# Patient Record
Sex: Female | Born: 1995 | Race: Black or African American | Hispanic: No | Marital: Single | Smoking: Current every day smoker
Health system: Southern US, Community
[De-identification: ages and names within clinical notes are randomized; demographics above are authoritative.]

## PROBLEM LIST (undated history)

## (undated) ENCOUNTER — Inpatient Hospital Stay: Payer: Self-pay

## (undated) DIAGNOSIS — Z789 Other specified health status: Secondary | ICD-10-CM

## (undated) DIAGNOSIS — D649 Anemia, unspecified: Secondary | ICD-10-CM

## (undated) HISTORY — PX: DENTAL SURGERY: SHX609

## (undated) HISTORY — DX: Anemia, unspecified: D64.9

## (undated) SURGERY — Surgical Case
Anesthesia: Spinal

---
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
## 2015-05-31 ENCOUNTER — Encounter: Payer: Self-pay | Admitting: Emergency Medicine

## 2015-05-31 ENCOUNTER — Emergency Department
Admission: EM | Admit: 2015-05-31 | Discharge: 2015-05-31 | Disposition: A | Discharge status: Home / Self Care | Payer: Medicaid Other | Attending: Emergency Medicine | Admitting: Emergency Medicine

## 2015-05-31 DIAGNOSIS — Z3A08 8 weeks gestation of pregnancy: Secondary | ICD-10-CM | POA: Diagnosis not present

## 2015-05-31 DIAGNOSIS — Z79899 Other long term (current) drug therapy: Secondary | ICD-10-CM | POA: Insufficient documentation

## 2015-05-31 DIAGNOSIS — O210 Mild hyperemesis gravidarum: Secondary | ICD-10-CM | POA: Insufficient documentation

## 2015-05-31 DIAGNOSIS — Z87891 Personal history of nicotine dependence: Secondary | ICD-10-CM | POA: Insufficient documentation

## 2015-05-31 DIAGNOSIS — O219 Vomiting of pregnancy, unspecified: Secondary | ICD-10-CM

## 2015-05-31 DIAGNOSIS — Z880 Allergy status to penicillin: Secondary | ICD-10-CM | POA: Diagnosis not present

## 2015-05-31 LAB — URINALYSIS COMPLETE WITH MICROSCOPIC (ARMC ONLY)
BACTERIA UA: NONE SEEN
Bilirubin Urine: NEGATIVE
GLUCOSE, UA: 50 mg/dL — AB
HGB URINE DIPSTICK: NEGATIVE
LEUKOCYTES UA: NEGATIVE
NITRITE: NEGATIVE
Protein, ur: 100 mg/dL — AB
SPECIFIC GRAVITY, URINE: 1.024 (ref 1.005–1.030)
pH: 6 (ref 5.0–8.0)

## 2015-05-31 LAB — CBC
HEMATOCRIT: 39.5 % (ref 35.0–47.0)
Hemoglobin: 12.8 g/dL (ref 12.0–16.0)
MCH: 26.8 pg (ref 26.0–34.0)
MCHC: 32.5 g/dL (ref 32.0–36.0)
MCV: 82.2 fL (ref 80.0–100.0)
Platelets: 325 10*3/uL (ref 150–440)
RBC: 4.8 MIL/uL (ref 3.80–5.20)
RDW: 12.8 % (ref 11.5–14.5)
WBC: 12 10*3/uL — AB (ref 3.6–11.0)

## 2015-05-31 LAB — COMPREHENSIVE METABOLIC PANEL
ALT: 12 U/L — ABNORMAL LOW (ref 14–54)
AST: 19 U/L (ref 15–41)
Albumin: 5.3 g/dL — ABNORMAL HIGH (ref 3.5–5.0)
Alkaline Phosphatase: 54 U/L (ref 38–126)
Anion gap: 11 (ref 5–15)
BUN: 11 mg/dL (ref 6–20)
CHLORIDE: 102 mmol/L (ref 101–111)
CO2: 25 mmol/L (ref 22–32)
Calcium: 11.2 mg/dL — ABNORMAL HIGH (ref 8.9–10.3)
Creatinine, Ser: 0.57 mg/dL (ref 0.44–1.00)
Glucose, Bld: 102 mg/dL — ABNORMAL HIGH (ref 65–99)
POTASSIUM: 3.5 mmol/L (ref 3.5–5.1)
Sodium: 138 mmol/L (ref 135–145)
Total Bilirubin: 0.9 mg/dL (ref 0.3–1.2)
Total Protein: 9.3 g/dL — ABNORMAL HIGH (ref 6.5–8.1)

## 2015-05-31 LAB — POCT PREGNANCY, URINE: Preg Test, Ur: POSITIVE — AB

## 2015-05-31 MED ORDER — SODIUM CHLORIDE 0.9 % IV BOLUS (SEPSIS)
1000.0000 mL | Freq: Once | INTRAVENOUS | Status: AC
  Administered 2015-05-31: 1000 mL via INTRAVENOUS

## 2015-05-31 MED ORDER — ONDANSETRON 4 MG PO TBDP: 4.0000 mg | ORAL_TABLET | Freq: Three times a day (TID) | ORAL | Status: DC | PRN

## 2015-05-31 MED ORDER — ONDANSETRON HCL 4 MG/2ML IJ SOLN
4.0000 mg | Freq: Once | INTRAMUSCULAR | Status: AC
  Administered 2015-05-31: 4 mg via INTRAVENOUS
  Filled 2015-05-31: qty 2

## 2015-05-31 NOTE — ED Provider Notes (Signed)
[facility] Emergency Department Provider Note  Time seen: 5:12 PM  I have reviewed the triage vital signs and the nursing notes.   HISTORY  Chief Complaint Morning Sickness    HPI Shelby Cooper is a 19 y.o. female who is approximately 7-[redacted] weeks pregnant presents the department for nausea and vomiting. According to the patient for the past several weeks she has felt extremely nauseated with nearly daily vomiting. For the past few days she has been unable to keep down any fluids. Patient went to a doctor yesterday who diagnosed her with pregnancy, but did not prescribe her a nausea medication. Continues to be nauseated today so she came to the emergency department. Patient denies abdominal pain, vaginal bleeding. Denies diarrhea. Describes her nausea severe. Unable to swallow her saliva as it'll make her vomit per patient.     History reviewed. No pertinent past medical history.  There are no active problems to display for this patient.   History reviewed. No pertinent past surgical history.  Current Outpatient Rx  Name  Route  Sig  Dispense  Refill  . Prenatal Vit-Fe Fumarate-FA (PRENATAL MULTIVITAMIN) TABS tablet   Oral   Take 1 tablet by mouth daily at 12 noon.           Allergies Penicillins  History reviewed. No pertinent family history.  Social History Social History  Substance Use Topics  . Smoking status: Former Games developermoker  . Smokeless tobacco: None  . Alcohol Use: No    Review of Systems Constitutional: Negative for fever. Cardiovascular: Negative for chest pain. Respiratory: Negative for shortness of breath. Gastrointestinal: Negative for abdominal pain. Positive nausea/vomiting Genitourinary: Negative for dysuria. Neurological: Negative for headache 10-point ROS otherwise negative.  ____________________________________________   PHYSICAL EXAM:  VITAL SIGNS: ED Triage Vitals  Enc Vitals Group     BP 05/31/15 1626 124/78  mmHg     Pulse Rate 05/31/15 1626 100     Resp --      Temp 05/31/15 1626 98.6 F (37 C)     Temp Source 05/31/15 1626 Oral     SpO2 05/31/15 1626 100 %     Weight 05/31/15 1626 102 lb (46.267 kg)     Height 05/31/15 1626 5\' 1"  (1.549 m)     Head Cir --      Peak Flow --      Pain Score --      Pain Loc --      Pain Edu? --      Excl. in GC? --     Constitutional: Alert and oriented. Well appearing and in no distress. Eyes: Normal exam ENT   Head: Normocephalic and atraumatic   Mouth/Throat: Mucous membranes are moist. Cardiovascular: Normal rate, regular rhythm. No murmur. Rate around 100 bpm Respiratory: Normal respiratory effort without tachypnea nor retractions. Breath sounds are clear Gastrointestinal: Soft and nontender. No distention.  Musculoskeletal: Nontender with normal range of motion in all extremities. Neurologic:  Normal speech and language. No gross focal neurologic deficits  Skin:  Skin is warm, dry and intact.  Psychiatric: Mood and affect are normal. Speech and behavior are normal.   ____________________________________________     INITIAL IMPRESSION / ASSESSMENT AND PLAN / ED COURSE  Pertinent labs & imaging results that were available during my care of the patient were reviewed by me and considered in my medical decision making (see chart for details).  Patient presents with nausea/vomiting, unable to eat or keep down fluids for the  past several days. Patient is approximate 7-[redacted] weeks pregnant by LMP. Denies abdominal pain. We will check labs, IV hydrate. I discussed with the patient the risks and benefits of using Zofran, the patient is aware of the wrist after our conversation, but wishes to proceed with the use of the medication.  Patient states she is feeling much better at this time. She wishes to go home. We'll discharge the patient home with Zofran ODT and she is to follow-up with her OB/GYN per patient  agreeable.  ____________________________________________   FINAL CLINICAL IMPRESSION(S) / ED DIAGNOSES  Nausea and vomiting of pregnancy   Minna Antis, MD 05/31/15 1850

## 2015-05-31 NOTE — ED Notes (Signed)
Pt from home for N/V unable to eat. Pt told at health dept she was [redacted] weeks pregnant

## 2015-05-31 NOTE — Discharge Instructions (Signed)

## 2015-06-08 ENCOUNTER — Other Ambulatory Visit: Payer: Self-pay | Admitting: Advanced Practice Midwife

## 2015-06-08 DIAGNOSIS — Z369 Encounter for antenatal screening, unspecified: Secondary | ICD-10-CM

## 2015-06-20 LAB — OB RESULTS CONSOLE HIV ANTIBODY (ROUTINE TESTING): HIV: NONREACTIVE

## 2015-07-03 ENCOUNTER — Ambulatory Visit: Payer: Self-pay

## 2015-07-03 ENCOUNTER — Ambulatory Visit: Payer: Self-pay | Attending: Advanced Practice Midwife

## 2015-07-06 ENCOUNTER — Ambulatory Visit (HOSPITAL_BASED_OUTPATIENT_CLINIC_OR_DEPARTMENT_OTHER)
Admission: RE | Admit: 2015-07-06 | Discharge: 2015-07-06 | Disposition: A | Discharge status: Home / Self Care | Payer: Medicaid Other | Source: Ambulatory Visit

## 2015-07-06 ENCOUNTER — Ambulatory Visit
Admission: RE | Admit: 2015-07-06 | Discharge: 2015-07-06 | Disposition: A | Discharge status: Home / Self Care | Payer: Medicaid Other | Source: Ambulatory Visit | Attending: Advanced Practice Midwife | Admitting: Advanced Practice Midwife

## 2015-07-06 VITALS — BP 109/69 | HR 100 | Temp 98.8°F | Resp 18 | Ht 61.2 in | Wt 112.8 lb

## 2015-07-06 DIAGNOSIS — Z0373 Encounter for suspected fetal anomaly ruled out: Secondary | ICD-10-CM

## 2015-07-06 DIAGNOSIS — Z369 Encounter for antenatal screening, unspecified: Secondary | ICD-10-CM

## 2015-07-06 DIAGNOSIS — Z36 Encounter for antenatal screening of mother: Secondary | ICD-10-CM | POA: Insufficient documentation

## 2015-07-06 DIAGNOSIS — Z3A13 13 weeks gestation of pregnancy: Secondary | ICD-10-CM | POA: Diagnosis not present

## 2015-07-06 NOTE — Progress Notes (Addendum)
Shelby Cooper, Shelby Cooper Length of Consultation: 30 minutes   [name]  was referred to [facility] for genetic counseling to review prenatal screening and testing options.  This note summarizes the information we discussed.    We offered the following routine screening tests for this pregnancy:  First trimester screening, which includes nuchal translucency ultrasound screen and first trimester maternal serum marker screening.  The nuchal translucency has approximately an 80% detection rate for Down syndrome and can be positive for other chromosome abnormalities as well as congenital heart defects.  When combined with Cooper maternal serum marker screening, the detection rate is up to 90% for Down syndrome and up to 97% for trisomy 18.     Maternal serum marker screening, Cooper blood test that measures pregnancy proteins, can provide risk assessments for Down syndrome, trisomy 18, and open neural tube defects (spina bifida, anencephaly). Because it does not directly examine the fetus, it cannot positively diagnose or rule out these problems.  Targeted ultrasound uses high frequency sound waves to create an image of the developing fetus.  An ultrasound is often recommended as Cooper routine means of evaluating the pregnancy.  It is also used to screen for fetal anatomy problems (for example, Cooper heart defect) that might be suggestive of Cooper chromosomal or other abnormality.   Should these screening tests indicate an increased concern, then the following additional testing options would be offered:  The chorionic villus sampling procedure is available for first trimester chromosome analysis.  This involves the withdrawal of Cooper small amount of chorionic villi (tissue from the developing placenta).  Risk of pregnancy loss is estimated to be approximately 1 in 200 to 1 in 100 (0.5 to 1%).  There is approximately Cooper 1% (1 in 100) chance that the CVS chromosome results will be unclear.  Chorionic villi cannot  be tested for neural tube defects.     Amniocentesis involves the removal of Cooper small amount of amniotic fluid from the sac surrounding the fetus with the use of Cooper thin needle inserted through the maternal abdomen and uterus.  Ultrasound guidance is used throughout the procedure.  Fetal cells from amniotic fluid are directly evaluated and > 99.5% of chromosome problems and > 98% of open neural tube defects can be detected. This procedure is generally performed after the 15th week of pregnancy.  The main risks to this procedure include complications leading to miscarriage in less than 1 in 200 cases (0.5%).  As another option for information if the pregnancy is suspected to be an an increased chance for certain chromosome conditions, we also reviewed the availability of cell free fetal DNA testing from maternal blood to determine whether or not the baby may have either Down syndrome, trisomy 1113, or trisomy 7118.  This test utilizes Cooper maternal blood sample and DNA sequencing technology to isolate circulating cell free fetal DNA from maternal plasma.  The fetal DNA can then be analyzed for DNA sequences that are derived from the three most common chromosomes involved in aneuploidy, chromosomes 13, 18, and 21.  If the overall amount of DNA is greater than the expected level for any of these chromosomes, aneuploidy is suspected.  While we do not consider it Cooper replacement for invasive testing and karyotype analysis, Cooper negative result from this testing would be reassuring, though not Cooper guarantee of Cooper normal chromosome complement for the baby.  An abnormal result is certainly suggestive of an abnormal chromosome complement, though we would still recommend  CVS or amniocentesis to confirm any findings from this testing.   Cystic Fibrosis screening was also discussed with the patient. Cystic fibrosis (CF) is one of the most common genetic conditions in persons of Caucasian ancestry.  This condition occurs in approximately  1 in 2,500 Caucasian persons and results in thickened secretions in the lungs, digestive, and reproductive systems.  For Cooper baby to be at risk for having CF, both of the parents must be carriers for this condition.  Approximately 1 in 6 Caucasian persons is Cooper carrier for CF.  Current carrier testing looks for the most common mutations in the gene for CF and can detect approximately 90% of carriers in the Caucasian population.  This means that the carrier screening can greatly reduce, but cannot eliminate, the chance for an individual to have Cooper child with CF.  If an individual is found to be Cooper carrier for CF, then carrier testing would be available for the partner. As part of Shelby Cooper's newborn screening profile, all babies born in the state of [place] will have Cooper two-tier screening process.  Specimens are first tested to determine the concentration of immunoreactive trypsinogen (IRT).  The top 5% of specimens with the highest IRT values then undergo DNA testing using Cooper panel of over 40 common CF mutations.   We obtained Cooper detailed family history and pregnancy history.  The family history was reported to be unremarkable for birth defects, mental retardation, recurrent pregnancy loss or known chromosome abnormalities.  Both Shelby Cooper and her partner are of African American ancestry.  No results of sickle cell testing were in the records sent from [facility].  If this testing has not been performed as part of her new OB labs, we would recommend this testing.  Her MCV is normal.  Shelby Cooper stated that this is the first pregnancy for her. The father of the baby, Shelby Cooper, has Cooper healthy 62 year old daughter.  She reported no complications or exposures in this pregnancy that would be expected to increase the risk for birth defects.  After consideration of the options, Shelby Cooper elected to proceed with first trimester screening.  An ultrasound was performed at the time of the visit.  The gestational  age was consistent with 13 weeks.  Fetal anatomy could not be assessed due to early gestational age.  Please refer to the ultrasound report for details of that study.  Ms. Burger was encouraged to call with questions or concerns.  We can be contacted at [phone].  Cherly Anderson, MS, CGC    I reviewed the ultrasound with the patient and her partner  I agree with the counselors note  Jimmey Ralph, MD

## 2015-07-13 ENCOUNTER — Telehealth: Payer: Self-pay | Admitting: Obstetrics and Gynecology

## 2015-07-13 NOTE — Telephone Encounter (Signed)
  Ms. Haan elected to undergo First Trimester screening on 07/06/2015.  To review, first trimester screening, includes nuchal translucency ultrasound screen and/or first trimester maternal serum marker screening.  The nuchal translucency has approximately an 80% detection rate for Down syndrome and can be positive for other chromosome abnormalities as well as heart defects.  When combined with a maternal serum marker screening, the detection rate is up to 90% for Down syndrome and up to 97% for trisomy 13 and 18.     The results of the First Trimester Nuchal Translucency and Biochemical Screening were within normal range.  The risk for Down syndrome is now estimated to be less than 1 in 10,000.  The risk for Trisomy 13/18 is also estimated to be less than 1 in 10,000.  Should more definitive information be desired, we would offer amniocentesis.  Because we do not yet know the effectiveness of combined first and second trimester screening, we do not recommend a maternal serum screen to assess the chance for chromosome conditions.  However, if screening for neural tube defects is desired, maternal serum screening for AFP only can be performed between 15 and [redacted] weeks gestation.     Cherly Anderson, MS, CGC

## 2015-08-10 ENCOUNTER — Ambulatory Visit
Admission: RE | Admit: 2015-08-10 | Discharge: 2015-08-10 | Disposition: A | Discharge status: Home / Self Care | Payer: Medicaid Other | Source: Ambulatory Visit | Attending: Maternal & Fetal Medicine | Admitting: Maternal & Fetal Medicine

## 2015-08-10 ENCOUNTER — Other Ambulatory Visit: Payer: Self-pay | Admitting: Obstetrics and Gynecology

## 2015-08-10 DIAGNOSIS — Z3A18 18 weeks gestation of pregnancy: Secondary | ICD-10-CM | POA: Insufficient documentation

## 2015-08-10 DIAGNOSIS — Z0373 Encounter for suspected fetal anomaly ruled out: Secondary | ICD-10-CM | POA: Diagnosis present

## 2015-08-10 DIAGNOSIS — Z36 Encounter for antenatal screening of mother: Secondary | ICD-10-CM | POA: Diagnosis not present

## 2015-10-13 LAB — OB RESULTS CONSOLE HIV ANTIBODY (ROUTINE TESTING)
HIV: NONREACTIVE
HIV: NONREACTIVE

## 2015-10-13 LAB — OB RESULTS CONSOLE ABO/RH
RH TYPE: POSITIVE
RH Type: POSITIVE

## 2015-10-13 LAB — OB RESULTS CONSOLE RUBELLA ANTIBODY, IGM
RUBELLA: IMMUNE
Rubella: IMMUNE

## 2015-10-13 LAB — OB RESULTS CONSOLE VARICELLA ZOSTER ANTIBODY, IGG
Varicella: IMMUNE
Varicella: IMMUNE

## 2015-10-13 LAB — OB RESULTS CONSOLE RPR
RPR: NONREACTIVE
RPR: NONREACTIVE

## 2015-10-13 LAB — OB RESULTS CONSOLE HEPATITIS B SURFACE ANTIGEN
HEP B S AG: NEGATIVE
Hepatitis B Surface Ag: NEGATIVE

## 2015-10-29 LAB — OB RESULTS CONSOLE VARICELLA ZOSTER ANTIBODY, IGG: Varicella: IMMUNE

## 2015-10-29 LAB — OB RESULTS CONSOLE ABO/RH: RH Type: POSITIVE

## 2015-10-29 LAB — OB RESULTS CONSOLE RUBELLA ANTIBODY, IGM: Rubella: IMMUNE

## 2015-10-29 LAB — OB RESULTS CONSOLE GC/CHLAMYDIA
CHLAMYDIA, DNA PROBE: NEGATIVE
GC PROBE AMP, GENITAL: NEGATIVE

## 2015-10-29 LAB — OB RESULTS CONSOLE RPR: RPR: NONREACTIVE

## 2015-12-21 ENCOUNTER — Observation Stay
Admission: EM | Admit: 2015-12-21 | Discharge: 2015-12-21 | Disposition: A | Discharge status: Home / Self Care | Payer: Medicaid Other | Attending: Obstetrics & Gynecology | Admitting: Obstetrics & Gynecology

## 2015-12-21 DIAGNOSIS — O9989 Other specified diseases and conditions complicating pregnancy, childbirth and the puerperium: Principal | ICD-10-CM | POA: Insufficient documentation

## 2015-12-21 DIAGNOSIS — Z3A31 31 weeks gestation of pregnancy: Secondary | ICD-10-CM | POA: Diagnosis not present

## 2015-12-21 NOTE — OB Triage Note (Signed)
Pt presents to l/d with c/o thinking her water broke. No big gush of fluid. States she was on the way to grocery store and "got choked". Had a small leakage of fluid after that. nitrazine negative. Cervix closed.

## 2015-12-21 NOTE — Discharge Summary (Addendum)
Patient presented for evaluation of rupture of membranes.  Patient had cervical exam, and nitrizine test by RN and this was reported to me.  Nitrazine was negative.  Baby was ballottable on exam.  She ambulated for 30mins with a pad and no fluid was collected.  She was again nitrazined and was negative.  I reviewed her vital signs and fetal tracing, both of which were reassuring.  Patient was discharged as she was not laboring and not ruptured.  NST interpretation: Reactive.  Ranae Plumberhelsea Bern Fare, MD Attending Obstetrician and Gynecologist [facility]

## 2015-12-29 LAB — OB RESULTS CONSOLE GBS: STREP GROUP B AG: POSITIVE

## 2016-01-04 ENCOUNTER — Observation Stay
Admission: EM | Admit: 2016-01-04 | Discharge: 2016-01-04 | Disposition: A | Discharge status: Home / Self Care | Payer: Medicaid Other | Attending: Obstetrics and Gynecology | Admitting: Obstetrics and Gynecology

## 2016-01-04 DIAGNOSIS — Z3A39 39 weeks gestation of pregnancy: Secondary | ICD-10-CM | POA: Diagnosis not present

## 2016-01-04 DIAGNOSIS — O9989 Other specified diseases and conditions complicating pregnancy, childbirth and the puerperium: Secondary | ICD-10-CM | POA: Diagnosis present

## 2016-01-04 DIAGNOSIS — O4292 Full-term premature rupture of membranes, unspecified as to length of time between rupture and onset of labor: Principal | ICD-10-CM | POA: Insufficient documentation

## 2016-01-04 DIAGNOSIS — O4290 Premature rupture of membranes, unspecified as to length of time between rupture and onset of labor, unspecified weeks of gestation: Secondary | ICD-10-CM | POA: Diagnosis present

## 2016-01-04 NOTE — Final Progress Note (Addendum)
Physician Final Progress Note  Patient ID: Delonda Coley MRN: [id number] DOB/AGE: [birth year] 20 y.o.  Admit date: 01/04/2016 Admitting provider: Vena Austria, MD Discharge date: 01/04/2016   Admission Diagnoses: Leaking fluid  Discharge Diagnoses:  Active Problems:   Amniotic fluid leaking  Patient is a 20 year old G1P0 [redacted]w[redacted]d followed at [facility] presenting with concerns for leaking fluid.  On presentation cervix closed, no evidence of SROM on exam, reactive NST, and stable vitals.  Routine labor precautions, discharge home.  This patient was triaged remotely.  Consults: None  Significant Findings/ Diagnostic Studies: Filed Vitals:   01/04/16 1244 01/04/16 1252  Temp: 98.6 F (37 C)   TempSrc: Oral   Resp: 18   Height:   (1.549 m)  Weight:  73.936 kg (163 lb)   Procedures: NST - fetal heart tone 150, moderate variability, + 15 x 15 accels, no decelerations.  Toco irregular.    Discharge Condition: good  Disposition: 01-Home or Self Care  Diet: Regular diet  Discharge Activity: Activity as tolerated  Discharge Instructions    Discharge activity:  No Restrictions    Complete by:  As directed      Fetal Kick Count:  Lie on our left side for one hour after a meal, and count the number of times your baby kicks.  If it is less than 5 times, get up, move around and drink some juice.  Repeat the test 30 minutes later.  If it is still less than 5 kicks in an hour, notify your doctor.    Complete by:  As directed      LABOR:  When conractions begin, you should start to time them from the beginning of one contraction to the beginning  of the next.  When contractions are 5 - 10 minutes apart or less and have been regular for at least an hour, you should call your health care provider.    Complete by:  As directed      No sexual activity restrictions    Complete by:  As directed      Notify physician for bleeding from the vagina    Complete by:  As directed       Notify physician for blurring of vision or spots before the eyes    Complete by:  As directed      Notify physician for chills or fever    Complete by:  As directed      Notify physician for fainting spells, "black outs" or loss of consciousness    Complete by:  As directed      Notify physician for increase in vaginal discharge    Complete by:  As directed      Notify physician for leaking of fluid    Complete by:  As directed      Notify physician for pain or burning when urinating    Complete by:  As directed      Notify physician for pelvic pressure (sudden increase)    Complete by:  As directed      Notify physician for severe or continued nausea or vomiting    Complete by:  As directed      Notify physician for sudden gushing of fluid from the vagina (with or without continued leaking)    Complete by:  As directed      Notify physician for sudden, constant, or occasional abdominal pain    Complete by:  As directed  Notify physician if baby moving less than usual    Complete by:  As directed             Medication List    TAKE these medications        ondansetron 4 MG disintegrating tablet  Commonly known as:  ZOFRAN ODT  Take 1 tablet (4 mg total) by mouth every 8 (eight) hours as needed for nausea or vomiting.     prenatal multivitamin Tabs tablet  Take 1 tablet by mouth daily at 12 noon.         Total time spent taking care of this patient: 10 minutes.  Triaged remotely exam by nursing staff, interpretation of NST by myself.  SignedLorrene Reid: Ivon Oelkers M 01/04/2016, 2:02 PM

## 2016-01-04 NOTE — OB Triage Note (Signed)
G1P0 Patient complains of leaking fluid starting around 0800 this morning and has noticed leaking on and off throughout the rest of the morning.  Notes clear fluid.  No contractions.

## 2016-01-07 ENCOUNTER — Inpatient Hospital Stay
Admission: EM | Admit: 2016-01-07 | Discharge: 2016-01-11 | DRG: 765 | Disposition: A | Discharge status: Home / Self Care | Payer: Medicaid Other | Attending: Obstetrics and Gynecology | Admitting: Obstetrics and Gynecology

## 2016-01-07 ENCOUNTER — Encounter: Payer: Self-pay | Admitting: *Deleted

## 2016-01-07 DIAGNOSIS — D62 Acute posthemorrhagic anemia: Secondary | ICD-10-CM | POA: Diagnosis present

## 2016-01-07 DIAGNOSIS — O9081 Anemia of the puerperium: Secondary | ICD-10-CM | POA: Diagnosis not present

## 2016-01-07 DIAGNOSIS — O864 Pyrexia of unknown origin following delivery: Secondary | ICD-10-CM | POA: Diagnosis not present

## 2016-01-07 DIAGNOSIS — Z87891 Personal history of nicotine dependence: Secondary | ICD-10-CM

## 2016-01-07 DIAGNOSIS — O99824 Streptococcus B carrier state complicating childbirth: Secondary | ICD-10-CM | POA: Diagnosis present

## 2016-01-07 DIAGNOSIS — R000 Tachycardia, unspecified: Secondary | ICD-10-CM | POA: Diagnosis not present

## 2016-01-07 DIAGNOSIS — O411230 Chorioamnionitis, third trimester, not applicable or unspecified: Secondary | ICD-10-CM | POA: Diagnosis not present

## 2016-01-07 DIAGNOSIS — Z3A39 39 weeks gestation of pregnancy: Secondary | ICD-10-CM | POA: Diagnosis not present

## 2016-01-07 HISTORY — DX: Other specified health status: Z78.9

## 2016-01-07 LAB — TYPE AND SCREEN
ABO/RH(D): AB POS
Antibody Screen: NEGATIVE

## 2016-01-07 LAB — CBC
HCT: 34.6 % — ABNORMAL LOW (ref 35.0–47.0)
Hemoglobin: 11 g/dL — ABNORMAL LOW (ref 12.0–16.0)
MCH: 24.1 pg — ABNORMAL LOW (ref 26.0–34.0)
MCHC: 31.9 g/dL — ABNORMAL LOW (ref 32.0–36.0)
MCV: 75.5 fL — ABNORMAL LOW (ref 80.0–100.0)
PLATELETS: 389 10*3/uL (ref 150–440)
RBC: 4.58 MIL/uL (ref 3.80–5.20)
RDW: 17.4 % — ABNORMAL HIGH (ref 11.5–14.5)
WBC: 14.8 10*3/uL — AB (ref 3.6–11.0)

## 2016-01-07 MED ORDER — LACTATED RINGERS IV SOLN: 500.0000 mL | INTRAVENOUS | Status: DC | PRN

## 2016-01-07 MED ORDER — LIDOCAINE HCL (PF) 1 % IJ SOLN
INTRAMUSCULAR | Status: AC
  Administered 2016-01-08: 1 mL via INTRADERMAL
  Filled 2016-01-07: qty 30

## 2016-01-07 MED ORDER — ONDANSETRON HCL 4 MG/2ML IJ SOLN
4.0000 mg | Freq: Four times a day (QID) | INTRAMUSCULAR | Status: DC | PRN
  Administered 2016-01-08: 8 mg via INTRAVENOUS

## 2016-01-07 MED ORDER — VANCOMYCIN HCL IN DEXTROSE 1-5 GM/200ML-% IV SOLN
1000.0000 mg | Freq: Two times a day (BID) | INTRAVENOUS | Status: DC
  Administered 2016-01-07: 1000 mg via INTRAVENOUS
  Filled 2016-01-07 (×2): qty 200

## 2016-01-07 MED ORDER — OXYTOCIN 40 UNITS IN LACTATED RINGERS INFUSION - SIMPLE MED
INTRAVENOUS | Status: AC
  Administered 2016-01-08: 1 m[IU]/min via INTRAVENOUS
  Filled 2016-01-07: qty 1000

## 2016-01-07 MED ORDER — ACETAMINOPHEN 325 MG PO TABS: 650.0000 mg | ORAL_TABLET | ORAL | Status: DC | PRN

## 2016-01-07 MED ORDER — LACTATED RINGERS IV SOLN
INTRAVENOUS | Status: DC
  Administered 2016-01-07 – 2016-01-08 (×2): via INTRAVENOUS

## 2016-01-07 MED ORDER — BUTORPHANOL TARTRATE 1 MG/ML IJ SOLN
2.0000 mg | INTRAMUSCULAR | Status: DC | PRN
  Administered 2016-01-07: 2 mg via INTRAVENOUS
  Filled 2016-01-07: qty 2

## 2016-01-07 MED ORDER — OXYTOCIN BOLUS FROM INFUSION: 500.0000 mL | INTRAVENOUS | Status: DC

## 2016-01-07 MED ORDER — OXYTOCIN 40 UNITS IN LACTATED RINGERS INFUSION - SIMPLE MED
2.5000 [IU]/h | INTRAVENOUS | Status: DC
  Administered 2016-01-08: 500 mL via INTRAVENOUS
  Filled 2016-01-07: qty 1000

## 2016-01-07 MED ORDER — MISOPROSTOL 200 MCG PO TABS
ORAL_TABLET | ORAL | Status: AC
  Filled 2016-01-07: qty 4

## 2016-01-07 NOTE — OB Triage Note (Signed)
Recvd to OBS1 per wheelchair from ED.  C/O contractions that started at 01/05/2016 2200.  Oriented to room and plan of care explained.  Verbalized understanding and agrees with plan.  .Marland Kitchen

## 2016-01-07 NOTE — Progress Notes (Signed)
Called by nursing staff to assist with EKG interpretation for patient in labor with tachycardia.  EKG shows sinus tachycardia without ischemic changes.  Machine reads possible age indeterminate anterior infarct, patient has very small Q waves (less than 1 mm depth) in leads I and aVL.  This seems to be an over-read by the machine.  No ST changes, and patient is asymptomatic as far as anginal symtpoms go.  Kristeen MissWILLIS, Vy Badley FIELDING [facility] 01/08/2016, 12:02 AM

## 2016-01-07 NOTE — H&P (Signed)
Obstetric History and Physical  Eunice BlaseKatana Freilich is a 20 y.o. G1P0 with Estimated Date of Delivery: 01/11/16 per LMP and 18 wk US who presents at 2624w3d  presenting for contractions. Patient states she has been having regular contractions, bloody show after cervical exam, intact membranes, with active fetal movement.    Prenatal Course Source of Care: [facility], tx from [facility]   Pregnancy complications or risks: Anemia - pt stopped Fe Macrosomia per 39 week US H/o Chlamydia with negative TOC H/o THC use with neg repeat testing  Patient Active Problem List   Diagnosis Date Noted  . Labor and delivery, indication for care 12/21/2015   She plans to bottle feed She desires Depo-Provera for postpartum contraception.   Prenatal labs and studies: ABO, Rh: AB+  Antibody: neg Rubella: Immune Varicella: Immune RPR:  NR HBsAg:  Neg HIV: Neg GC/CT: Neg/Neg GBS: positive 1 hr Glucola: 115   Genetic screening: 1st trimester and MSAFP negative  TDAP: UTD   Prenatal Transfer Tool   Past Medical History  Diagnosis Date  . Medical history non-contributory     Past Surgical History  Procedure Laterality Date  . Dental surgery      OB History  Gravida Para Term Preterm AB SAB TAB Ectopic Multiple Living  1             # Outcome Date GA Lbr Len/2nd Weight Sex Delivery Anes PTL Lv  1 Current               Social History   Social History  . Marital Status: Single    Spouse Name: N/A  . Number of Children: N/A  . Years of Education: N/A   Social History Main Topics  . Smoking status: Former Games developermoker  . Smokeless tobacco: None  . Alcohol Use: No  . Drug Use: No  . Sexual Activity: Yes   Other Topics Concern  . None   Social History Narrative    History reviewed. No pertinent family history.  Prescriptions prior to admission  Medication Sig Dispense Refill Last Dose  . ondansetron (ZOFRAN ODT) 4 MG disintegrating tablet Take 1 tablet (4 mg total) by mouth every 8 (eight)  hours as needed for nausea or vomiting. (Patient not taking: Reported on 07/06/2015) 20 tablet 0 Not Taking at Unknown time  . Prenatal Vit-Fe Fumarate-FA (PRENATAL MULTIVITAMIN) TABS tablet Take 1 tablet by mouth daily at 12 noon.   01/03/2016 at Unknown time    Allergies  Allergen Reactions  . Penicillins Swelling    Review of Systems: Negative except for what is mentioned in HPI.  Physical Exam: BP 133/85 mmHg  Pulse 140  Temp(Src) 99 F (37.2 C) (Oral)  Resp 18  Ht 5\' 1"  (1.549 m)  Wt 170 lb (77.111 kg)  BMI 32.14 kg/m2  LMP 04/06/2015 GENERAL: Well-developed, well-nourished female in no acute distress.  ABDOMEN: Soft, nontender, nondistended, gravid. EXTREMITIES: Nontender, no edema Cervical Exam: 1.5 on presentation, now 3.5/100/-2, BBOW Presentation: cephalic FHT: Category: 1 Baseline rate 140 bpm   Variability moderate  Accelerations present   Decelerations none Contractions: Every 4-5 mins   Pertinent Labs/Studies:   No results found for this or any previous visit (from the past 24 hour(s)).  Assessment : IUP at 1424w3d, early labor  Plan: Admit for labor/augmentation - given suspected macrosomia per recent US and pt's now favorable cervix, will augment labor as needed  Pain management - Nitrous, IV stadol or epidural as requested Antibiotics for GBS prophylaxis -  Vancomycin as PCN allergic and Clinda/Erythromycin resistance not reported

## 2016-01-07 NOTE — Progress Notes (Signed)
S: Pt has received stadol and reports good relief from pain  O: BP stable, maternal HR: initial tachycardia on admission (130s-140s) down to 100-110s, now in 130s-150s    FHR: cat 1, FHR 150, mod variability, + accels, no decels  A: IUP at 10766w3d, labor  P: Consulted with Dr Bonney AidStaebler re: tachycardia, rec EKG. Ordered stat.   Pain management - pt happy with stadol for now, epidural once further progressed

## 2016-01-08 ENCOUNTER — Inpatient Hospital Stay: Payer: Medicaid Other | Admitting: Anesthesiology

## 2016-01-08 ENCOUNTER — Encounter
Admission: EM | Disposition: A | Discharge status: Home / Self Care | Payer: Self-pay | Source: Home / Self Care | Attending: Obstetrics and Gynecology

## 2016-01-08 ENCOUNTER — Encounter: Payer: Self-pay | Admitting: Anesthesiology

## 2016-01-08 LAB — URINE DRUG SCREEN, QUALITATIVE (ARMC ONLY)
Amphetamines, Ur Screen: NOT DETECTED
Barbiturates, Ur Screen: NOT DETECTED
Benzodiazepine, Ur Scrn: NOT DETECTED
Cannabinoid 50 Ng, Ur ~~LOC~~: NOT DETECTED
Cocaine Metabolite,Ur ~~LOC~~: NOT DETECTED
MDMA (Ecstasy)Ur Screen: NOT DETECTED
Methadone Scn, Ur: NOT DETECTED
Opiate, Ur Screen: NOT DETECTED
Phencyclidine (PCP) Ur S: NOT DETECTED
Tricyclic, Ur Screen: NOT DETECTED

## 2016-01-08 SURGERY — Surgical Case
Anesthesia: Epidural

## 2016-01-08 MED ORDER — FENTANYL CITRATE (PF) 100 MCG/2ML IJ SOLN: 25.0000 ug | INTRAMUSCULAR | Status: DC | PRN

## 2016-01-08 MED ORDER — BUPIVACAINE HCL 0.5 % IJ SOLN
15.0000 mL | Freq: Once | INTRAMUSCULAR | Status: DC
Start: 2016-01-08 — End: 2016-01-08
  Filled 2016-01-08: qty 15

## 2016-01-08 MED ORDER — SODIUM CHLORIDE 0.9% FLUSH: 3.0000 mL | INTRAVENOUS | Status: DC | PRN

## 2016-01-08 MED ORDER — BUPIVACAINE HCL (PF) 0.5 % IJ SOLN
INTRAMUSCULAR | Status: DC | PRN
  Administered 2016-01-08: 10 mL

## 2016-01-08 MED ORDER — SIMETHICONE 80 MG PO CHEW: 80.0000 mg | CHEWABLE_TABLET | ORAL | Status: DC | PRN

## 2016-01-08 MED ORDER — FENTANYL 2.5 MCG/ML W/ROPIVACAINE 0.2% IN NS 100 ML EPIDURAL INFUSION (ARMC-ANES)
10.0000 mL/h | EPIDURAL | Status: DC
  Administered 2016-01-08: 10 mL/h via EPIDURAL

## 2016-01-08 MED ORDER — OXYCODONE-ACETAMINOPHEN 5-325 MG PO TABS
1.0000 | ORAL_TABLET | ORAL | Status: DC | PRN
Start: 2016-01-09 — End: 2016-01-11

## 2016-01-08 MED ORDER — DEXTROSE 5 % IV SOLN
500.0000 mg | INTRAVENOUS | Status: AC
  Administered 2016-01-08: 500 mg via INTRAVENOUS
  Filled 2016-01-08: qty 500

## 2016-01-08 MED ORDER — CLINDAMYCIN PHOSPHATE 900 MG/50ML IV SOLN
900.0000 mg | INTRAVENOUS | Status: AC
  Administered 2016-01-08: 900 mg via INTRAVENOUS
  Filled 2016-01-08: qty 50

## 2016-01-08 MED ORDER — NALBUPHINE HCL 10 MG/ML IJ SOLN: 5.0000 mg | INTRAMUSCULAR | Status: DC | PRN

## 2016-01-08 MED ORDER — GENTAMICIN SULFATE 40 MG/ML IJ SOLN
5.0000 mg/kg | INTRAVENOUS | Status: DC
  Filled 2016-01-08: qty 9.75

## 2016-01-08 MED ORDER — DIPHENHYDRAMINE HCL 25 MG PO CAPS: 25.0000 mg | ORAL_CAPSULE | ORAL | Status: DC | PRN

## 2016-01-08 MED ORDER — FENTANYL CITRATE (PF) 100 MCG/2ML IJ SOLN
INTRAMUSCULAR | Status: DC | PRN
  Administered 2016-01-08: 20 ug via INTRAVENOUS

## 2016-01-08 MED ORDER — NALBUPHINE HCL 10 MG/ML IJ SOLN: 5.0000 mg | Freq: Once | INTRAMUSCULAR | Status: DC | PRN

## 2016-01-08 MED ORDER — LACTATED RINGERS IV SOLN: INTRAVENOUS | Status: DC

## 2016-01-08 MED ORDER — COCONUT OIL OIL: 1.0000 "application " | TOPICAL_OIL | Status: DC | PRN

## 2016-01-08 MED ORDER — EPHEDRINE SULFATE 50 MG/ML IJ SOLN
INTRAMUSCULAR | Status: DC | PRN
  Administered 2016-01-08 (×2): 10 mg via INTRAVENOUS

## 2016-01-08 MED ORDER — ACETAMINOPHEN 325 MG PO TABS
650.0000 mg | ORAL_TABLET | ORAL | Status: DC | PRN
  Administered 2016-01-08 – 2016-01-09 (×2): 650 mg via ORAL
  Filled 2016-01-08 (×2): qty 2

## 2016-01-08 MED ORDER — OXYCODONE-ACETAMINOPHEN 5-325 MG PO TABS
2.0000 | ORAL_TABLET | ORAL | Status: DC | PRN
Start: 2016-01-09 — End: 2016-01-11

## 2016-01-08 MED ORDER — BUPIVACAINE IN DEXTROSE 0.75-8.25 % IT SOLN
INTRATHECAL | Status: DC | PRN
  Administered 2016-01-08: 1.4 mL via INTRATHECAL

## 2016-01-08 MED ORDER — OXYTOCIN 40 UNITS IN LACTATED RINGERS INFUSION - SIMPLE MED
1.0000 m[IU]/min | INTRAVENOUS | Status: DC
Start: 2016-01-08 — End: 2016-01-08
  Administered 2016-01-08: 1 m[IU]/min via INTRAVENOUS

## 2016-01-08 MED ORDER — GENTAMICIN SULFATE 40 MG/ML IJ SOLN
115.6500 mg | INTRAVENOUS | Status: DC | PRN
  Administered 2016-01-08: 390 mg via INTRAVENOUS

## 2016-01-08 MED ORDER — OXYCODONE HCL 5 MG/5ML PO SOLN: 5.0000 mg | Freq: Once | ORAL | Status: DC | PRN

## 2016-01-08 MED ORDER — MORPHINE SULFATE (PF) 0.5 MG/ML IJ SOLN
INTRAMUSCULAR | Status: DC | PRN
  Administered 2016-01-08: .2 mg via EPIDURAL

## 2016-01-08 MED ORDER — NALOXONE HCL 2 MG/2ML IJ SOSY: 1.0000 ug/kg/h | PREFILLED_SYRINGE | INTRAVENOUS | Status: DC | PRN

## 2016-01-08 MED ORDER — PRENATAL MULTIVITAMIN CH
1.0000 | ORAL_TABLET | Freq: Every day | ORAL | Status: DC
  Administered 2016-01-09 – 2016-01-11 (×3): 1 via ORAL
  Filled 2016-01-08 (×3): qty 1

## 2016-01-08 MED ORDER — IBUPROFEN 600 MG PO TABS
600.0000 mg | ORAL_TABLET | Freq: Four times a day (QID) | ORAL | Status: DC
  Administered 2016-01-09 – 2016-01-11 (×9): 600 mg via ORAL
  Filled 2016-01-08 (×9): qty 1

## 2016-01-08 MED ORDER — DIPHENHYDRAMINE HCL 25 MG PO CAPS: 25.0000 mg | ORAL_CAPSULE | Freq: Four times a day (QID) | ORAL | Status: DC | PRN

## 2016-01-08 MED ORDER — TERBUTALINE SULFATE 1 MG/ML IJ SOLN: 0.2500 mg | Freq: Once | INTRAMUSCULAR | Status: DC | PRN

## 2016-01-08 MED ORDER — FENTANYL 2.5 MCG/ML W/ROPIVACAINE 0.2% IN NS 100 ML EPIDURAL INFUSION (ARMC-ANES)
EPIDURAL | Status: AC
  Filled 2016-01-08: qty 100

## 2016-01-08 MED ORDER — DIPHENHYDRAMINE HCL 50 MG/ML IJ SOLN: 12.5000 mg | INTRAMUSCULAR | Status: DC | PRN

## 2016-01-08 MED ORDER — PHENYLEPHRINE HCL 10 MG/ML IJ SOLN
INTRAMUSCULAR | Status: DC | PRN
  Administered 2016-01-08: 200 ug via INTRAVENOUS
  Administered 2016-01-08: 100 ug via INTRAVENOUS
  Administered 2016-01-08: 200 ug via INTRAVENOUS
  Administered 2016-01-08 (×4): 100 ug via INTRAVENOUS
  Administered 2016-01-08: 200 ug via INTRAVENOUS
  Administered 2016-01-08 (×4): 100 ug via INTRAVENOUS

## 2016-01-08 MED ORDER — WITCH HAZEL-GLYCERIN EX PADS: 1.0000 "application " | MEDICATED_PAD | CUTANEOUS | Status: DC | PRN

## 2016-01-08 MED ORDER — SIMETHICONE 80 MG PO CHEW: 80.0000 mg | CHEWABLE_TABLET | Freq: Three times a day (TID) | ORAL | Status: DC

## 2016-01-08 MED ORDER — SENNOSIDES-DOCUSATE SODIUM 8.6-50 MG PO TABS: 2.0000 | ORAL_TABLET | ORAL | Status: DC

## 2016-01-08 MED ORDER — SOD CITRATE-CITRIC ACID 500-334 MG/5ML PO SOLN: 30.0000 mL | ORAL | Status: DC

## 2016-01-08 MED ORDER — BUPIVACAINE HCL (PF) 0.5 % IJ SOLN
INTRAMUSCULAR | Status: AC
  Filled 2016-01-08: qty 30

## 2016-01-08 MED ORDER — LIDOCAINE-EPINEPHRINE (PF) 1.5 %-1:200000 IJ SOLN
INTRAMUSCULAR | Status: DC | PRN
  Administered 2016-01-08: 3 mL via EPIDURAL

## 2016-01-08 MED ORDER — DIBUCAINE 1 % RE OINT: 1.0000 "application " | TOPICAL_OINTMENT | RECTAL | Status: DC | PRN

## 2016-01-08 MED ORDER — SOD CITRATE-CITRIC ACID 500-334 MG/5ML PO SOLN
ORAL | Status: AC
  Filled 2016-01-08: qty 15

## 2016-01-08 MED ORDER — SIMETHICONE 80 MG PO CHEW: 80.0000 mg | CHEWABLE_TABLET | ORAL | Status: DC

## 2016-01-08 MED ORDER — OXYCODONE HCL 5 MG PO TABS: 5.0000 mg | ORAL_TABLET | Freq: Once | ORAL | Status: DC | PRN

## 2016-01-08 MED ORDER — OXYTOCIN 40 UNITS IN LACTATED RINGERS INFUSION - SIMPLE MED
2.5000 [IU]/h | INTRAVENOUS | Status: DC
  Administered 2016-01-08: 2.5 [IU]/h via INTRAVENOUS
  Filled 2016-01-08: qty 1000

## 2016-01-08 MED ORDER — MENTHOL 3 MG MT LOZG
1.0000 | LOZENGE | OROMUCOSAL | Status: DC | PRN
Start: 2016-01-08 — End: 2016-01-11
  Filled 2016-01-08: qty 9

## 2016-01-08 MED ORDER — BUPIVACAINE HCL (PF) 0.25 % IJ SOLN
INTRAMUSCULAR | Status: DC | PRN
  Administered 2016-01-08: 5 mL via EPIDURAL

## 2016-01-08 MED ORDER — BUPIVACAINE 0.25 % ON-Q PUMP DUAL CATH 400 ML
400.0000 mL | INJECTION | Status: DC
  Filled 2016-01-08: qty 400

## 2016-01-08 MED ORDER — NALOXONE HCL 0.4 MG/ML IJ SOLN: 0.4000 mg | INTRAMUSCULAR | Status: DC | PRN

## 2016-01-08 SURGICAL SUPPLY — 28 items
BAG COUNTER SPONGE EZ (MISCELLANEOUS) ×2 IMPLANT
CANISTER SUCT 3000ML (MISCELLANEOUS) ×3 IMPLANT
CATH KIT ON-Q SILVERSOAK 5IN (CATHETERS) ×6 IMPLANT
CHLORAPREP W/TINT 26ML (MISCELLANEOUS) ×6 IMPLANT
CLOSURE WOUND 1/2 X4 (GAUZE/BANDAGES/DRESSINGS) ×1
COUNTER SPONGE BAG EZ (MISCELLANEOUS) ×1
DRSG TELFA 3X8 NADH (GAUZE/BANDAGES/DRESSINGS) ×3 IMPLANT
ELECT CAUTERY BLADE 6.4 (BLADE) ×3 IMPLANT
ELECT REM PT RETURN 9FT ADLT (ELECTROSURGICAL) ×3
ELECTRODE REM PT RTRN 9FT ADLT (ELECTROSURGICAL) ×1 IMPLANT
GAUZE SPONGE 4X4 12PLY STRL (GAUZE/BANDAGES/DRESSINGS) ×3 IMPLANT
GLOVE BIO SURGEON STRL SZ7 (GLOVE) ×3 IMPLANT
GLOVE INDICATOR 7.5 STRL GRN (GLOVE) ×3 IMPLANT
GOWN STRL REUS W/ TWL LRG LVL3 (GOWN DISPOSABLE) ×3 IMPLANT
GOWN STRL REUS W/TWL LRG LVL3 (GOWN DISPOSABLE) ×6
LIQUID BAND (GAUZE/BANDAGES/DRESSINGS) ×6 IMPLANT
NS IRRIG 1000ML POUR BTL (IV SOLUTION) ×3 IMPLANT
PACK C SECTION AR (MISCELLANEOUS) ×3 IMPLANT
PAD OB MATERNITY 4.3X12.25 (PERSONAL CARE ITEMS) ×3 IMPLANT
PAD PREP 24X41 OB/GYN DISP (PERSONAL CARE ITEMS) ×3 IMPLANT
SPONGE LAP 18X18 5 PK (GAUZE/BANDAGES/DRESSINGS) ×3 IMPLANT
STAPLER INSORB 30 2030 C-SECTI (MISCELLANEOUS) ×3 IMPLANT
STRIP CLOSURE SKIN 1/2X4 (GAUZE/BANDAGES/DRESSINGS) ×2 IMPLANT
SUT MNCRL AB 4-0 PS2 18 (SUTURE) ×3 IMPLANT
SUT PDS AB 1 TP1 96 (SUTURE) ×6 IMPLANT
SUT VIC AB 0 CTX 36 (SUTURE) ×18
SUT VIC AB 0 CTX36XBRD ANBCTRL (SUTURE) ×9 IMPLANT
SUT VIC AB 2-0 CT1 36 (SUTURE) ×3 IMPLANT

## 2016-01-08 NOTE — Progress Notes (Signed)
L&D Note  01/08/2016 - 3:33 AM  20 y.o. G1P0 7576w4d   [name] is admitted for Labor   Subjective:  Pt comfortable with epidural. Pt snoring prior to being awoken for exam.   Objective:   Filed Vitals:   01/08/16 0130 01/08/16 0145 01/08/16 0200 01/08/16 0300  BP: 130/75 123/81 126/77 94/65  Pulse: 120 116 146 125  Temp:      TempSrc:      Resp:      Height:      Weight:      SpO2: 99% 100% 100%     Current Vital Signs 24h Vital Sign Ranges  T 99 F (37.2 C) Temp  Avg: 99.1 F (37.3 C)  Min: 99 F (37.2 C)  Max: 99.3 F (37.4 C)  BP 94/65 mmHg BP  Min: 94/65  Max: 138/76  HR (!) 125 Pulse  Avg: 125  Min: 103  Max: 146  RR 20 Resp  Avg: 19  Min: 18  Max: 20  SaO2 100 % Not Delivered SpO2  Avg: 99.2 %  Min: 96 %  Max: 100 %       24 Hour I/O Current Shift I/O  Time Ins Outs 07/16 0701 - 07/17 0700 In: 1075 [I.V.:875] Out: -  07/16 1901 - 07/17 0700 In: 1075 [I.V.:875] Out: -     FHR: baseline 150, minimal variability. + acceleration with fetal scalp stimulation Toco: q 1-5 min, pattern of ctx q 1-2 min x 3, then space of 5 min without ctx SVE: 6/80/-1   Assessment :  IUP at 4076w4d, labor    Plan:  Begin pitocin augmenation  Andelyn Spade, [place], [place]

## 2016-01-08 NOTE — Anesthesia Postprocedure Evaluation (Signed)
Anesthesia Post Note  Patient: Shelby Cooper  Procedure(s) Performed: Procedure(s) (LRB): CESAREAN SECTION (N/A)  Patient location during evaluation: PACU Anesthesia Type: General Level of consciousness: awake and alert Pain management: pain level controlled Vital Signs Assessment: post-procedure vital signs reviewed and stable Respiratory status: spontaneous breathing, nonlabored ventilation, respiratory function stable and patient connected to nasal cannula oxygen Cardiovascular status: blood pressure returned to baseline and stable Postop Assessment: no signs of nausea or vomiting Anesthetic complications: no    Last Vitals:  Filed Vitals:   01/08/16 0915 01/08/16 0921  BP: 98/67   Pulse: 123 143  Temp:    Resp: 18 23    Last Pain:  Filed Vitals:   01/08/16 0921  PainSc: 0-No pain                 Yevette EdwardsJames G Adams

## 2016-01-08 NOTE — Progress Notes (Signed)
IUPC placed intravaginally.  Pt c/o pain on left side - position adjusted. RN to f/u with anesthesia prn.

## 2016-01-08 NOTE — Anesthesia Preprocedure Evaluation (Signed)
Anesthesia Evaluation  Patient identified by MRN, date of birth, ID band Patient awake    Reviewed: Allergy & Precautions, H&P , NPO status , Patient's Chart, lab work & pertinent test results  Airway Mallampati: III  TM Distance: >3 FB Neck ROM: full    Dental  (+) Poor Dentition   Pulmonary neg shortness of breath, former smoker,    Pulmonary exam normal breath sounds clear to auscultation       Cardiovascular Exercise Tolerance: Good (-) hypertension(-) angina(-) Past MI and (-) DOE negative cardio ROS   Rhythm:regular Rate:Tachycardia     Neuro/Psych    GI/Hepatic negative GI ROS,   Endo/Other    Renal/GU   negative genitourinary   Musculoskeletal   Abdominal   Peds  Hematology negative hematology ROS (+)   Anesthesia Other Findings Past Medical History:   Medical history non-contributory                            Past Surgical History:   DENTAL SURGERY                                               BMI    Body Mass Index   32.13 kg/m 2      Reproductive/Obstetrics (+) Pregnancy                             Anesthesia Physical  Anesthesia Plan  ASA: II  Anesthesia Plan: Epidural   Post-op Pain Management:    Induction:   Airway Management Planned:   Additional Equipment:   Intra-op Plan:   Post-operative Plan:   Informed Consent: I have reviewed the patients History and Physical, chart, labs and discussed the procedure including the risks, benefits and alternatives for the proposed anesthesia with the patient or authorized representative who has indicated his/her understanding and acceptance.     Plan Discussed with: Anesthesiologist  Anesthesia Plan Comments:         Anesthesia Quick Evaluation

## 2016-01-08 NOTE — Progress Notes (Signed)
Subjective:  No concerns, one sided epidural.    Objective:   Vitals: Blood pressure 128/82, pulse 139, temperature 100.2 F (37.9 C), temperature source Oral, resp. rate 20, height 5\' 1"  (1.549 m), weight 77.111 kg (170 lb), last menstrual period 04/06/2015, SpO2 100 %. General:  Abdomen: Cervical Exam:  Dilation: 5 Effacement (%): 90 Station: -1 Presentation: Vertex Exam by:: TB, CNM  FHT: 160-170, minimal to moderate, no accels, has had intermittent lates Toco: q1-643min  Results for orders placed or performed during the hospital encounter of 01/07/16 (from the past 24 hour(s))  CBC     Status: Abnormal   Collection Time: 01/07/16  8:15 PM  Result Value Ref Range   WBC 14.8 (H) 3.6 - 11.0 K/uL   RBC 4.58 3.80 - 5.20 MIL/uL   Hemoglobin 11.0 (L) 12.0 - 16.0 g/dL   HCT 78.234.6 (L) 95.635.0 - 21.347.0 %   MCV 75.5 (L) 80.0 - 100.0 fL   MCH 24.1 (L) 26.0 - 34.0 pg   MCHC 31.9 (L) 32.0 - 36.0 g/dL   RDW 08.617.4 (H) 57.811.5 - 46.914.5 %   Platelets 389 150 - 440 K/uL  Type and screen [facility]     Status: None   Collection Time: 01/07/16  8:15 PM  Result Value Ref Range   ABO/RH(D) AB POS    Antibody Screen NEG    Sample Expiration 01/10/2016   Urine Drug Screen, Qualitative ([facility] only)     Status: None   Collection Time: 01/08/16 12:17 AM  Result Value Ref Range   Tricyclic, Ur Screen NONE DETECTED NONE DETECTED   Amphetamines, Ur Screen NONE DETECTED NONE DETECTED   MDMA (Ecstasy)Ur Screen NONE DETECTED NONE DETECTED   Cocaine Metabolite,Ur [place] NONE DETECTED NONE DETECTED   Opiate, Ur Screen NONE DETECTED NONE DETECTED   Phencyclidine (PCP) Ur S NONE DETECTED NONE DETECTED   Cannabinoid 50 Ng, Ur [place] NONE DETECTED NONE DETECTED   Barbiturates, Ur Screen NONE DETECTED NONE DETECTED   Benzodiazepine, Ur Scrn NONE DETECTED NONE DETECTED   Methadone Scn, Ur NONE DETECTED NONE DETECTED    Assessment:   20 y.o. G1P0 5551w4d term labor, non-reassuring fetal  surveillance  Plan:   1) Labor - protracted active phase  2) Fetus -  Category two tracing some loss of variability and fetal tachycardia.  Patient does not meet criteria for chorio at present but had a temp of 100.2   3) Given protracted latent face concerning for CPD and the fetal heart rate tracing will proceed with 1LTCS for delivery

## 2016-01-08 NOTE — OR Nursing (Signed)
Patient has a ON Q pump in place

## 2016-01-08 NOTE — Transfer of Care (Signed)
Immediate Anesthesia Transfer of Care Note  Patient: Shelby Cooper  Procedure(s) Performed: Procedure(s): CESAREAN SECTION (N/A)  Patient Location: PACU  Anesthesia Type:Spinal  Level of Consciousness: awake, alert  and oriented  Airway & Oxygen Therapy: Patient Spontanous Breathing  Post-op Assessment: Report given to RN and Post -op Vital signs reviewed and stable  Post vital signs: Reviewed and stable  Last Vitals:  Filed Vitals:   01/08/16 0557 01/08/16 0600  BP:  128/82  Pulse:  139  Temp: 37.9 C   Resp:      Last Pain:  Filed Vitals:   01/08/16 0604  PainSc: 8          Complications: No apparent anesthesia complications

## 2016-01-08 NOTE — Anesthesia Procedure Notes (Addendum)
Date/Time: 01/08/2016 7:15 AM Performed by: Nelda Marseille Pre-anesthesia Checklist: Patient identified, Emergency Drugs available, Suction available, Patient being monitored and Timeout performed Oxygen Delivery Method: Nasal cannula   Spinal Patient location during procedure: OR Start time: 01/08/2016 7:32 AM Staffing Anesthesiologist: Molli Barrows Performed by: anesthesiologist  Preanesthetic Checklist Completed: patient identified, site marked, surgical consent, pre-op evaluation, timeout performed, IV checked, risks and benefits discussed and monitors and equipment checked Spinal Block Patient position: sitting Prep: Betadine Patient monitoring: heart rate, continuous pulse ox, blood pressure and cardiac monitor Approach: midline Location: L4-5 Injection technique: single-shot Needle Needle type: Whitacre and Introducer  Needle gauge: 24 G Needle length: 9 cm Additional Notes Negative paresthesia. Negative blood return. Positive free-flowing CSF. Expiration date of kit checked and confirmed. Patient tolerated procedure well, without complications.

## 2016-01-08 NOTE — Anesthesia Procedure Notes (Signed)
Epidural Patient location during procedure: OB Start time: 01/08/2016 1:06 AM End time: 01/08/2016 1:11 AM  Staffing Anesthesiologist: Margorie JohnPISCITELLO, JOSEPH K Performed by: anesthesiologist   Preanesthetic Checklist Completed: patient identified, site marked, surgical consent, pre-op evaluation, timeout performed, IV checked, risks and benefits discussed and monitors and equipment checked  Epidural Patient position: sitting Prep: Betadine Patient monitoring: heart rate, continuous pulse ox and blood pressure Approach: midline Location: L4-L5 Injection technique: LOR saline  Needle:  Needle type: Tuohy  Needle gauge: 17 G Needle length: 9 cm and 9 Needle insertion depth: 8 cm Catheter type: closed end flexible Catheter size: 19 Gauge Catheter at skin depth: 13 cm Test dose: negative and 1.5% lidocaine with Epi 1:200 K  Assessment Sensory level: T10 Events: blood not aspirated, injection not painful, no injection resistance, negative IV test and no paresthesia  Additional Notes Pt. Evaluated and documentation done after procedure finished. Patient identified. Risks/Benefits/Options discussed with patient including but not limited to bleeding, infection, nerve damage, paralysis, failed block, incomplete pain control, headache, blood pressure changes, nausea, vomiting, reactions to medication both or allergic, itching and postpartum back pain. Confirmed with bedside nurse the patient's most recent platelet count. Confirmed with patient that they are not currently taking any anticoagulation, have any bleeding history or any family history of bleeding disorders. Patient expressed understanding and wished to proceed. All questions were answered. Sterile technique was used throughout the entire procedure. Please see nursing notes for vital signs. Test dose was given through epidural catheter and negative prior to continuing to dose epidural or start infusion. Warning signs of high block given  to the patient including shortness of breath, tingling/numbness in hands, complete motor block, or any concerning symptoms with instructions to call for help. Patient was given instructions on fall risk and not to get out of bed. All questions and concerns addressed with instructions to call with any issues or inadequate analgesia.   Patient tolerated the insertion well without immediate complications.Reason for block:procedure for pain

## 2016-01-08 NOTE — Op Note (Signed)
Preoperative Diagnosis: 1) 20 y.o. G1P0 at [redacted]w[redacted]d 2) Non-reassuring fetal surveillance 3) Protracted active phase 4) Fetal tachycardia  Postoperative Diagnosis: 1) 20 y.o. G1P0 at [redacted]w[redacted]d 2) Non-reassuring fetal surveillance 3) Protracted active phase 4) Fetal tachycardia  Operation Performed: Primary low transverse C-section via pfannenstiel skin incision  Indication: Fetal tachycardia with loss of variability and late decelerations, maternal temp to 100.2, no fundal tenderness.  Fetal heartones checked following spinal and noted to be in the 70's  Anesthesia: Spinal  Primary Surgeon: Vena Austria, MD  Preoperative Antibiotics: /kg of gentamycin,  of clindamycin,  of azithromycin  Estimated Blood Loss:  IV Fluids:  Urine Output::  Drains or Tubes: Foley to gravity drainage, ON-Q catheter system  Implants: none  Specimens Removed: none  Complications: none  Intraoperative Findings:  Normal tubes ovaries and uterus.  Delivery resulted in the birth of a liveborn female, APGAR (1 MIN): 7, APGAR (5 MINS): 9  , weight pending  Patient Condition: stable  Procedure in Detail:  Patient was taken to the operating room were she was administered regional anesthesia.  She was positioned in the supine position, prepped and draped in the  Usual sterile fashion.  Prior to proceeding with the case a time out was performed and the level of anesthetic was checked and noted to be adequate.  Utilizing the scalpel a pfannenstiel skin incision was made 2cm above the pubic symphysis and carried down sharply to the the level of the rectus fascia.  The fascia was incised in the midline using the scalpel and then extended bluntly.  The midline was identified, the peritoneum was entered bluntly and expanded using manual tractions.  The uterus was noted to be in a none rotated position.  Next the bladder blade was placed retracting the bladder caudally.  A bladder flap  was not created.  A low transverse incision was scored on the lower uterine segment.  The hysterotomy was entered bluntly using the operators finger.  The hysterotomy incision was extended using manual traction.  The operators hand was placed within the hysterotomy position noting the fetus to be within the OA position.  The vertex was grasped, flexed, brought to the incision, and delivered a traumatically using fundal pressure.  The remainder of the body delivered with ease.  The infant was suctioned, cord was clamped and cut before handing off to the awaiting neonatologist.  The placenta was delivered using manual extraction.  The uterus was exteriorized, wiped clean of clots and debris using two moist laps.  The hysterotomy was closed using a two layer closure of 0 Vicryl, with the first being a running locked, the second a vertical imbricating.  The uterus was returned to the abdomen.  The peritoneal gutters were wiped clean of clots and debris using two moist laps.  The hysterotomy incision was re-inspected noted to be hemostatic.  The rectus muscles were re-approximated in the midline using a single 2-0 Vicryl mattress stitch.  The rectus muscles were inspected noted to be hemostatic.  The superior border of the rectus fascia was grasped with a Kocher clamp.  The ON-Q trocars were then placed 4cm above the superior border of the incision and tunneled subfascially.  The introducers were removed and the catheters were threaded through t72he sleeves after which the sleeves were removed.  The fascia was closed using a looped #1 PDS in a running fashion taking 1cm by 1cm bites.  The subcutaneous tissue was irrigated using warm saline, hemostasis achieved using  the bovis.  The subcutaneous dead space was greater than 3cm and was closed.  The subcutaneous dead space was obliterated by using a 53-T 0 Chromic in a running fashion.   The skin was closed using insorb staples.  Sponge needle and instrument counts were  corrects times two.  The patient tolerated the procedure well and was taken to the recovery room in stable condition.

## 2016-01-08 NOTE — Progress Notes (Signed)
L&D Note  01/08/2016 - 5:48 AM  20 y.o. G1P0 6740w4d   [name] is admitted for labor   Subjective:  Pt comfortable with epidural  Objective:   Filed Vitals:   01/08/16 0200 01/08/16 0300 01/08/16 0400 01/08/16 0500  BP: 126/77 94/65 109/76 124/73  Pulse: 146 125 134 148  Temp:  99 F (37.2 C)    TempSrc:  Oral    Resp:      Height:      Weight:      SpO2: 100%       Current Vital Signs 24h Vital Sign Ranges  T 99 F (37.2 C) Temp  Avg: 99.1 F (37.3 C)  Min: 99 F (37.2 C)  Max: 99.3 F (37.4 C)  BP 124/73 mmHg BP  Min: 94/65  Max: 138/76  HR (!) 148 Pulse  Avg: 127.3  Min: 103  Max: 148  RR 20 Resp  Avg: 19  Min: 18  Max: 20  SaO2 100 % Not Delivered SpO2  Avg: 99.2 %  Min: 96 %  Max: 100 %       24 Hour I/O Current Shift I/O  Time Ins Outs 07/16 0701 - 07/17 0700 In: 1320 [P.O.:120; I.V.:1000] Out: -  07/16 1901 - 07/17 0700 In: 1320 [P.O.:120; I.V.:1000] Out: -     FHR: questionable prolonged FHR deceleration at 0520 vs innacurate tracing, FHR to 170s-180s x approx 10 minutes, now in 160s with moderate variability Toco: regular pattern, q 2-3 SVE: 5-6/80/-2, cervix swelling slightly, to pt's left   Assessment :  IUP at 4440w4d, labor    Plan:  Consulted with Dr Bonney AidStaebler about pt's status, minimal cervical change over the past several hours and current FHR tracing, he will come and evaluate.   Marta AntuBrothers, Kapena Hamme, [place]

## 2016-01-08 NOTE — OR Nursing (Signed)
Patient is moving legs

## 2016-01-08 NOTE — Progress Notes (Signed)
In room to see pt d/t late FHR decelerations noted. Cervix 5 cm. FHR to 170s, then to baseline in 150s-160s with accelerations. Will continue to monitor closely.

## 2016-01-08 NOTE — Anesthesia Preprocedure Evaluation (Signed)
Anesthesia Evaluation  Patient identified by MRN, date of birth, ID band Patient awake    Reviewed: Allergy & Precautions, H&P , NPO status , Patient's Chart, lab work & pertinent test results  Airway Mallampati: III  TM Distance: >3 FB Neck ROM: full    Dental  (+) Poor Dentition   Pulmonary neg pulmonary ROS, neg shortness of breath, former smoker,    Pulmonary exam normal breath sounds clear to auscultation       Cardiovascular Exercise Tolerance: Good (-) hypertension(-) angina(-) Past MI and (-) DOE negative cardio ROS   Rhythm:regular Rate:Tachycardia     Neuro/Psych    GI/Hepatic negative GI ROS,   Endo/Other    Renal/GU   negative genitourinary   Musculoskeletal   Abdominal   Peds  Hematology negative hematology ROS (+)   Anesthesia Other Findings Past Medical History:   Medical history non-contributory                            Past Surgical History:   DENTAL SURGERY                                               BMI    Body Mass Index   32.13 kg/m 2      Reproductive/Obstetrics (+) Pregnancy                             Anesthesia Physical Anesthesia Plan  ASA: II  Anesthesia Plan: Epidural   Post-op Pain Management:    Induction:   Airway Management Planned:   Additional Equipment:   Intra-op Plan:   Post-operative Plan:   Informed Consent: I have reviewed the patients History and Physical, chart, labs and discussed the procedure including the risks, benefits and alternatives for the proposed anesthesia with the patient or authorized representative who has indicated his/her understanding and acceptance.     Plan Discussed with: Anesthesiologist  Anesthesia Plan Comments:         Anesthesia Quick Evaluation

## 2016-01-09 LAB — CBC
HCT: 24.3 % — ABNORMAL LOW (ref 35.0–47.0)
Hemoglobin: 7.9 g/dL — ABNORMAL LOW (ref 12.0–16.0)
MCH: 24.1 pg — AB (ref 26.0–34.0)
MCHC: 32.4 g/dL (ref 32.0–36.0)
MCV: 74.4 fL — ABNORMAL LOW (ref 80.0–100.0)
PLATELETS: 299 10*3/uL (ref 150–440)
RBC: 3.27 MIL/uL — AB (ref 3.80–5.20)
RDW: 17.3 % — AB (ref 11.5–14.5)
WBC: 23.3 10*3/uL — ABNORMAL HIGH (ref 3.6–11.0)

## 2016-01-09 LAB — RPR: RPR Ser Ql: NONREACTIVE

## 2016-01-09 MED ORDER — FERROUS SULFATE 325 (65 FE) MG PO TABS
325.0000 mg | ORAL_TABLET | Freq: Two times a day (BID) | ORAL | Status: DC
  Administered 2016-01-09 – 2016-01-11 (×4): 325 mg via ORAL
  Filled 2016-01-09 (×4): qty 1

## 2016-01-09 MED ORDER — CLINDAMYCIN PHOSPHATE 600 MG/50ML IV SOLN
600.0000 mg | Freq: Four times a day (QID) | INTRAVENOUS | Status: AC
  Administered 2016-01-09 – 2016-01-10 (×4): 600 mg via INTRAVENOUS
  Filled 2016-01-09 (×4): qty 50

## 2016-01-09 NOTE — Anesthesia Postprocedure Evaluation (Cosign Needed)
Anesthesia Post Note  Patient: Shelby Cooper  Procedure(s) Performed: * No procedures listed *  Patient location during evaluation: Mother Baby Anesthesia Type: Spinal Level of consciousness: awake, awake and alert and oriented Pain management: pain level controlled Vital Signs Assessment: post-procedure vital signs reviewed and stable Respiratory status: spontaneous breathing, nonlabored ventilation and respiratory function stable Cardiovascular status: blood pressure returned to baseline Postop Assessment: no headache Anesthetic complications: no    Last Vitals:  Filed Vitals:   01/09/16 0433 01/09/16 0530  BP: 110/62   Pulse: 144   Temp: 39.1 C 38.6 C  Resp: 18     Last Pain:  Filed Vitals:   01/09/16 0751  PainSc: 0-No pain                 Virgil Slinger,  Sheran FavaMark R

## 2016-01-09 NOTE — Progress Notes (Signed)
  Post-operative Day 1  Subjective: no complaints, up ad lib, voiding, tolerating PO and + flatus  Objective: Blood pressure 121/57, pulse 100, temperature 98 F (36.7 C), temperature source Oral, resp. rate 20, height 5\' 1"  (1.549 m), weight 170 lb (77.111 kg), last menstrual period 04/06/2015, SpO2 97 %, T: 102.4 this am, now stable  Physical Exam:  General: alert and cooperative Lochia: appropriate Uterine Fundus: firm Incision: healing well DVT Evaluation: No evidence of DVT seen on physical exam. Abdomen: mildly distended   Recent Labs  01/07/16 2015 01/09/16 0704  HGB 11.0* 7.9*  HCT 34.6* 24.3*    Assessment POD #1, acute blood loss anemia, post-operative temperature  Plan: Continue PO care, Advance activity as tolerated and Fe replacement, anemia precautions  IV cleocin 600 mg q6 hrs x 24   Feeding: bottle Contraception: Depo Blood Type: AB+ RI/VI TDAP UTD    Marta AntuBrothers, Patrice Moates, [place] 01/09/2016, 11:03 AM

## 2016-01-09 NOTE — Anesthesia Post-op Follow-up Note (Signed)
  Anesthesia Pain Follow-up Note  Patient: Shelby Cooper  Day #: 1  Date of Follow-up: 01/09/2016 Time: 8:02 AM  Last Vitals:  Filed Vitals:   01/09/16 0433 01/09/16 0530  BP: 110/62   Pulse: 144   Temp: 39.1 C 38.6 C  Resp: 18     Level of Consciousness: alert  Pain: none   Side Effects:None  Catheter Site Exam:clean, dry, no drainage     Plan: Continue current therapy  Naliah Eddington,  Sheran FavaMark R

## 2016-01-10 LAB — CBC
HCT: 22.4 % — ABNORMAL LOW (ref 35.0–47.0)
Hemoglobin: 7.3 g/dL — ABNORMAL LOW (ref 12.0–16.0)
MCH: 23.9 pg — AB (ref 26.0–34.0)
MCHC: 32.4 g/dL (ref 32.0–36.0)
MCV: 73.7 fL — ABNORMAL LOW (ref 80.0–100.0)
Platelets: 316 10*3/uL (ref 150–440)
RBC: 3.04 MIL/uL — ABNORMAL LOW (ref 3.80–5.20)
RDW: 17.4 % — AB (ref 11.5–14.5)
WBC: 20.2 10*3/uL — ABNORMAL HIGH (ref 3.6–11.0)

## 2016-01-10 NOTE — Progress Notes (Signed)
POD #2 LTCS Subjective:   Feels OK. Denies lightheadedness when OOB. Tolerating a regular diet. Passing flatus and has had several BMs.   Objective:  Blood pressure 111/81, pulse 89, temperature 98.5 F (36.9 C), temperature source Oral, resp. rate 20, height  (1.549 m), weight 77.111 kg (170 lb), last menstrual period 04/06/2015, SpO2 98 %, unknown if currently breastfeeding. Last fever yesterday around 0600. Clindamycin x 24 hours completed.  General: NAD, eating a taco Pulmonary: no increased work of breathing/ CTAB Heart: Tachycardic (132) with no murmurs heard. Prior to my exam the pulse rate was decreasing (80s-90s) Abdomen: non-distended, non-tender, fundus firm at level of umbilicus, bowel sounds active Incision: ON Q is leaking-dressing changed. Incision C+D+I Extremities:   Results for orders placed or performed during the hospital encounter of 01/07/16 (from the past 72 hour(s))  CBC     Status: Abnormal   Collection Time: 01/07/16  8:15 PM  Result Value Ref Range   WBC 14.8 (H) 3.6 - 11.0 K/uL   RBC 4.58 3.80 - 5.20 MIL/uL   Hemoglobin 11.0 (L) 12.0 - 16.0 g/dL   HCT 40.9 (L) 81.1 - 91.4 %   MCV 75.5 (L) 80.0 - 100.0 fL   MCH 24.1 (L) 26.0 - 34.0 pg   MCHC 31.9 (L) 32.0 - 36.0 g/dL   RDW 78.2 (H) 95.6 - 21.3 %   Platelets 389 150 - 440 K/uL  Type and screen [facility]     Status: None   Collection Time: 01/07/16  8:15 PM  Result Value Ref Range   ABO/RH(D) AB POS    Antibody Screen NEG    Sample Expiration 01/10/2016   RPR     Status: None   Collection Time: 01/07/16  8:15 PM  Result Value Ref Range   RPR Ser Ql Non Reactive Non Reactive    Comment: (NOTE) Performed At: [facility] [address] [place], [place] [id number] Mila Homer MD [id number]   Urine Drug Screen, Qualitative ([facility] only)     Status: None   Collection Time: 01/08/16 12:17 AM  Result Value Ref Range   Tricyclic, Ur Screen NONE DETECTED NONE  DETECTED   Amphetamines, Ur Screen NONE DETECTED NONE DETECTED   MDMA (Ecstasy)Ur Screen NONE DETECTED NONE DETECTED   Cocaine Metabolite,Ur [place] NONE DETECTED NONE DETECTED   Opiate, Ur Screen NONE DETECTED NONE DETECTED   Phencyclidine (PCP) Ur S NONE DETECTED NONE DETECTED   Cannabinoid 50 Ng, Ur [place] NONE DETECTED NONE DETECTED   Barbiturates, Ur Screen NONE DETECTED NONE DETECTED   Benzodiazepine, Ur Scrn NONE DETECTED NONE DETECTED   Methadone Scn, Ur NONE DETECTED NONE DETECTED    Comment: (NOTE) 100  Tricyclics, urine               Cutoff 1000 ng/mL 200  Amphetamines, urine             Cutoff 1000 ng/mL 300  MDMA (Ecstasy), urine           Cutoff 500 ng/mL 400  Cocaine Metabolite, urine       Cutoff 300 ng/mL 500  Opiate, urine                   Cutoff 300 ng/mL 600  Phencyclidine (PCP), urine      Cutoff 25 ng/mL 700  Cannabinoid, urine              Cutoff 50 ng/mL 800  Barbiturates, urine  Cutoff 200 ng/mL 900  Benzodiazepine, urine           Cutoff 200 ng/mL 1000 Methadone, urine                Cutoff 300 ng/mL 1100 1200 The urine drug screen provides only a preliminary, unconfirmed 1300 analytical test result and should not be used for non-medical 1400 purposes. Clinical consideration and professional judgment should 1500 be applied to any positive drug screen result due to possible 1600 interfering substances. A more specific alternate chemical method 1700 must be used in order to obtain a confirmed analytical result.  1800 Gas chromato graphy / mass spectrometry (GC/MS) is the preferred 1900 confirmatory method.   CBC     Status: Abnormal   Collection Time: 01/09/16  7:04 AM  Result Value Ref Range   WBC 23.3 (H) 3.6 - 11.0 K/uL   RBC 3.27 (L) 3.80 - 5.20 MIL/uL   Hemoglobin 7.9 (L) 12.0 - 16.0 g/dL    Comment: RESULT REPEATED AND VERIFIED   HCT 24.3 (L) 35.0 - 47.0 %   MCV 74.4 (L) 80.0 - 100.0 fL   MCH 24.1 (L) 26.0 - 34.0 pg   MCHC 32.4 32.0 - 36.0  g/dL   RDW 44.017.3 (H) 34.711.5 - 42.514.5 %   Platelets 299 150 - 440 K/uL  CBC     Status: Abnormal   Collection Time: 01/10/16  5:40 AM  Result Value Ref Range   WBC 20.2 (H) 3.6 - 11.0 K/uL   RBC 3.04 (L) 3.80 - 5.20 MIL/uL   Hemoglobin 7.3 (L) 12.0 - 16.0 g/dL   HCT 95.622.4 (L) 38.735.0 - 56.447.0 %   MCV 73.7 (L) 80.0 - 100.0 fL   MCH 23.9 (L) 26.0 - 34.0 pg   MCHC 32.4 32.0 - 36.0 g/dL   RDW 33.217.4 (H) 95.111.5 - 88.414.5 %   Platelets 316 150 - 440 K/uL     Assessment:   20 y.o. G1P1001 postoperativeday # 2  Postoperative fever-resolved with clindamycin  Continue to monitor temperature closely    Plan:  1)Chronic anemia worsened with acute blood loss - mild tachycardia but otherwise asymptomatic - po iron and vitamins -repeat CBC in AM -DC saline lock (has been in 4 days)  2) --/--/AB POS (07/16 2015) / Rubella Immune (05/07 0000) / Varicella immune  3) TDAP UTD   4) Bottle/Depo  5) Disposition-depending on fever, anemia  Shelby Cooper, CNM

## 2016-01-11 LAB — CBC
HCT: 21.4 % — ABNORMAL LOW (ref 35.0–47.0)
HEMOGLOBIN: 7 g/dL — AB (ref 12.0–16.0)
MCH: 24.4 pg — ABNORMAL LOW (ref 26.0–34.0)
MCHC: 32.8 g/dL (ref 32.0–36.0)
MCV: 74.3 fL — ABNORMAL LOW (ref 80.0–100.0)
Platelets: 351 10*3/uL (ref 150–440)
RBC: 2.88 MIL/uL — AB (ref 3.80–5.20)
RDW: 17.5 % — ABNORMAL HIGH (ref 11.5–14.5)
WBC: 13.6 10*3/uL — AB (ref 3.6–11.0)

## 2016-01-11 MED ORDER — MEDROXYPROGESTERONE ACETATE 150 MG/ML IM SUSP: 150.0000 mg | Freq: Once | INTRAMUSCULAR | Status: DC

## 2016-01-11 MED ORDER — MEDROXYPROGESTERONE ACETATE 150 MG/ML IM SUSP
150.0000 mg | Freq: Once | INTRAMUSCULAR | Status: AC
  Administered 2016-01-11: 150 mg via INTRAMUSCULAR
  Filled 2016-01-11: qty 1

## 2016-01-11 MED ORDER — OXYCODONE-ACETAMINOPHEN 5-325 MG PO TABS: 1.0000 | ORAL_TABLET | ORAL | Status: DC | PRN

## 2016-01-11 NOTE — Progress Notes (Signed)
Discharge instr reviewed with pt.  Discussed care of On Q pump and to remove on Sat/Sun.  Rx given for pain med.  Verb u/o

## 2016-01-11 NOTE — Discharge Instructions (Signed)
Cesarean Delivery, Care After °Refer to this sheet in the next few weeks. These instructions provide you with information on caring for yourself after your procedure. Your health care provider may also give you specific instructions. Your treatment has been planned according to current medical practices, but problems sometimes occur. Call your health care provider if you have any problems or questions after you go home. °HOME CARE INSTRUCTIONS  °· Only take over-the-counter or prescription medications as directed by your health care provider. °· Do not drink alcohol, especially if you are breastfeeding or taking medication to relieve pain. °· Do not chew or smoke tobacco. °· Continue to use good perineal care. Good perineal care includes: °¨ Wiping your perineum from front to back. °¨ Keeping your perineum clean. °· Check your surgical cut (incision) daily for increased redness, drainage, swelling, or separation of skin. °· Clean your incision gently with soap and water every day, and then pat it dry. If your health care provider says it is okay, leave the incision uncovered. Use a bandage (dressing) if the incision is draining fluid or appears irritated. If the adhesive strips across the incision do not fall off within 7 days, carefully peel them off. °· Hug a pillow when coughing or sneezing until your incision is healed. This helps to relieve pain. °· Do not use tampons or douche for the next 6 weeks.  °· Showers only, no tub baths for the next 6 weeks.  °· Wear a well-fitting bra that provides breast support. °· Limit wearing support panties or control-top hose. °· Drink enough fluids to keep your urine clear or pale yellow. °· Eat high-fiber foods such as whole grain cereals and breads, brown rice, beans, and fresh fruits and vegetables every day. These foods may help prevent or relieve constipation. °· Resume activities such as climbing stairs, driving, lifting, exercising, or traveling as directed by your  health care provider. °· No sexual intercourse for at least the next 6 weeks, and then not until you feel ready.  °· Try to have someone help you with your household activities and your newborn for at least a few days after you leave the hospital. °· Rest as much as possible. Try to rest or take a nap when your newborn is sleeping. °· Increase your activities gradually. °· Keep all of your scheduled postpartum appointments. It is very important to keep your scheduled follow-up appointments. At these appointments, your health care provider will be checking to make sure that you are healing physically and emotionally. °SEEK MEDICAL CARE IF:  °· You are passing large clots from your vagina. Save any clots to show your health care provider. °· You have a foul smelling discharge from your vagina. °· You have trouble urinating. °· You are urinating frequently. °· You have pain when you urinate. °· You have a change in your bowel movements. °· You have increasing redness, pain, or swelling near your incision. °· You have pus draining from your incision. °· Your incision is separating. °· You have painful, hard, or reddened breasts. °· You have a severe headache. °· You have blurred vision or see spots. °· You feel sad or depressed. °· You have thoughts of hurting yourself or your newborn. °· You have questions about your care, the care of your newborn, or medications. °· You are dizzy or light-headed. °· You have a rash. °· You have pain, redness, or swelling at the site of the removed intravenous access (IV) tube. °· You have nausea or   vomiting.  You have a fever above 100.4. SEEK IMMEDIATE MEDICAL CARE IF:  You have persistent pain.  You have chest pain.  You have shortness of breath.  You faint.  You have leg pain.  You have stomach pain.  Your vaginal bleeding saturates 2 or more sanitary pads in 1 hour. MAKE SURE YOU:   Understand these instructions.  Will watch your condition.  Will get help  right away if you are not doing well or get worse.   This information is not intended to replace advice given to you by your health care provider. Make sure you discuss any questions you have with your health care provider.

## 2016-01-11 NOTE — Discharge Summary (Signed)
Obstetric Discharge Summary Reason for Admission: onset of labor Prenatal Procedures: ultrasound Intrapartum Procedures: cesarean: low cervical, transverse Postpartum Procedures: none Complications-Operative and Postpartum: anemia, fever   HEMOGLOBIN  Date Value Ref Range Status  01/11/2016 7.0* 12.0 - 16.0 g/dL Final   HCT  Date Value Ref Range Status  01/11/2016 21.4* 35.0 - 47.0 % Final   Pt has progressed well since delivery. She was treated with antibiotics intrapartum and postpartum for fever/possible chorioamnionitis and has remained afebrile for the last 24 hours.  She has been ambulating and voiding without difficulty. She has had several bowel movements since delivery. She is tolerating PO intake and her pain is well controlled with PO meds. BP 107/71 mmHg  Pulse 111  Temp(Src) 98.5 F (36.9 C) (Oral)  Resp 18  Ht 5\' 1"  (1.549 m)  Wt 77.111 kg (170 lb)  BMI 32.14 kg/m2  SpO2 100%  LMP 04/06/2015  Bottle feeding AB+, Rubella Immune, Varicella Immune, TDAP UTD  Physical Exam:  General: alert, cooperative, appears stated age and no distress Lochia: appropriate Uterine Fundus: firm Incision: healing well DVT Evaluation: No evidence of DVT seen on physical exam.  Discharge Diagnoses: Term Pregnancy-delivered  Discharge Information:  Date: 01/11/2016 Activity: pelvic rest Diet: routine Medications: PNV and Percocet Condition: stable Instructions: refer to practice specific booklet Discharge to: home   Follow-up Information    Follow up with Lorrene ReidSTAEBLER, ANDREAS M, MD. Go in 1 week.   Specialty:  Obstetrics and Gynecology   Why:  incision check   Contact information:   [address] [place] [id number] [phone]       Newborn Data: Live born female  Birth Weight: 7 lb 11.1 oz (3490 g) APGAR: 7, 9  Home with mother.  Shelby MallGLEDHILL,Shelby Cooper, Shelby Cooper

## 2016-10-25 ENCOUNTER — Emergency Department
Admission: EM | Admit: 2016-10-25 | Discharge: 2016-10-25 | Discharge status: Against Medical Advice | Payer: Medicaid Other | Attending: Emergency Medicine | Admitting: Emergency Medicine

## 2016-10-25 ENCOUNTER — Encounter: Payer: Self-pay | Admitting: Emergency Medicine

## 2016-10-25 DIAGNOSIS — O219 Vomiting of pregnancy, unspecified: Secondary | ICD-10-CM | POA: Insufficient documentation

## 2016-10-25 DIAGNOSIS — O0281 Inappropriate change in quantitative human chorionic gonadotropin (hCG) in early pregnancy: Secondary | ICD-10-CM | POA: Diagnosis not present

## 2016-10-25 DIAGNOSIS — O210 Mild hyperemesis gravidarum: Secondary | ICD-10-CM

## 2016-10-25 DIAGNOSIS — Z3490 Encounter for supervision of normal pregnancy, unspecified, unspecified trimester: Secondary | ICD-10-CM

## 2016-10-25 DIAGNOSIS — Z3A00 Weeks of gestation of pregnancy not specified: Secondary | ICD-10-CM | POA: Insufficient documentation

## 2016-10-25 LAB — URINALYSIS, COMPLETE (UACMP) WITH MICROSCOPIC
BACTERIA UA: NONE SEEN
BILIRUBIN URINE: NEGATIVE
Glucose, UA: NEGATIVE mg/dL
Hgb urine dipstick: NEGATIVE
Ketones, ur: 80 mg/dL — AB
Leukocytes, UA: NEGATIVE
Nitrite: NEGATIVE
PH: 6 (ref 5.0–8.0)
Protein, ur: 100 mg/dL — AB
SPECIFIC GRAVITY, URINE: 1.027 (ref 1.005–1.030)

## 2016-10-25 LAB — COMPREHENSIVE METABOLIC PANEL
ALBUMIN: 5.1 g/dL — AB (ref 3.5–5.0)
ALT: 14 U/L (ref 14–54)
AST: 20 U/L (ref 15–41)
Alkaline Phosphatase: 73 U/L (ref 38–126)
Anion gap: 14 (ref 5–15)
BUN: 15 mg/dL (ref 6–20)
CHLORIDE: 102 mmol/L (ref 101–111)
CO2: 21 mmol/L — ABNORMAL LOW (ref 22–32)
CREATININE: 0.57 mg/dL (ref 0.44–1.00)
Calcium: 10.3 mg/dL (ref 8.9–10.3)
GFR calc Af Amer: >60 mL/min (ref >60)
GFR calc non Af Amer: >60 mL/min (ref >60)
GLUCOSE: 93 mg/dL (ref 65–99)
POTASSIUM: 3.1 mmol/L — AB (ref 3.5–5.1)
Sodium: 137 mmol/L (ref 135–145)
Total Bilirubin: 1 mg/dL (ref 0.3–1.2)
Total Protein: 9.3 g/dL — ABNORMAL HIGH (ref 6.5–8.1)

## 2016-10-25 LAB — CBC
HEMATOCRIT: 40.8 % (ref 35.0–47.0)
Hemoglobin: 12.9 g/dL (ref 12.0–16.0)
MCH: 23.4 pg — AB (ref 26.0–34.0)
MCHC: 31.6 g/dL — ABNORMAL LOW (ref 32.0–36.0)
MCV: 74.2 fL — AB (ref 80.0–100.0)
PLATELETS: 438 10*3/uL (ref 150–440)
RBC: 5.5 MIL/uL — ABNORMAL HIGH (ref 3.80–5.20)
RDW: 19.4 % — AB (ref 11.5–14.5)
WBC: 11.1 10*3/uL — ABNORMAL HIGH (ref 3.6–11.0)

## 2016-10-25 LAB — LIPASE, BLOOD: LIPASE: 21 U/L (ref 11–51)

## 2016-10-25 LAB — POCT PREGNANCY, URINE: PREG TEST UR: POSITIVE — AB

## 2016-10-25 LAB — HCG, QUANTITATIVE, PREGNANCY: HCG, BETA CHAIN, QUANT, S: 285780 m[IU]/mL — AB (ref <5)

## 2016-10-25 MED ORDER — ONDANSETRON 4 MG PO TBDP: 4.0000 mg | ORAL_TABLET | Freq: Three times a day (TID) | ORAL | 0 refills | Status: DC | PRN

## 2016-10-25 MED ORDER — ONDANSETRON 4 MG PO TBDP
ORAL_TABLET | ORAL | Status: AC
  Filled 2016-10-25: qty 1

## 2016-10-25 MED ORDER — ONDANSETRON 4 MG PO TBDP
4.0000 mg | ORAL_TABLET | Freq: Once | ORAL | Status: AC
  Administered 2016-10-25: 4 mg via ORAL

## 2016-10-25 NOTE — ED Notes (Signed)

## 2016-10-25 NOTE — ED Triage Notes (Signed)
When pt asked what her chief complaint is, pt pointed to baby and stated "I think I'm having another one of them". Pt states she took a [employer] pregnancy test and it was positive. Pt reports generalized abdominal pain and vomiting that began last night. Pt vomiting in triage.

## 2016-10-26 ENCOUNTER — Encounter: Payer: Self-pay | Admitting: Emergency Medicine

## 2016-10-26 ENCOUNTER — Emergency Department: Payer: Medicaid Other

## 2016-10-26 ENCOUNTER — Emergency Department
Admission: EM | Admit: 2016-10-26 | Discharge: 2016-10-26 | Disposition: A | Discharge status: Home / Self Care | Payer: Medicaid Other | Attending: Emergency Medicine | Admitting: Emergency Medicine

## 2016-10-26 DIAGNOSIS — O219 Vomiting of pregnancy, unspecified: Secondary | ICD-10-CM

## 2016-10-26 DIAGNOSIS — O99280 Endocrine, nutritional and metabolic diseases complicating pregnancy, unspecified trimester: Secondary | ICD-10-CM | POA: Insufficient documentation

## 2016-10-26 DIAGNOSIS — Z3A09 9 weeks gestation of pregnancy: Secondary | ICD-10-CM | POA: Insufficient documentation

## 2016-10-26 DIAGNOSIS — E869 Volume depletion, unspecified: Secondary | ICD-10-CM | POA: Diagnosis not present

## 2016-10-26 DIAGNOSIS — Z3491 Encounter for supervision of normal pregnancy, unspecified, first trimester: Secondary | ICD-10-CM

## 2016-10-26 DIAGNOSIS — Z79899 Other long term (current) drug therapy: Secondary | ICD-10-CM | POA: Diagnosis not present

## 2016-10-26 DIAGNOSIS — Z87891 Personal history of nicotine dependence: Secondary | ICD-10-CM | POA: Insufficient documentation

## 2016-10-26 DIAGNOSIS — E876 Hypokalemia: Secondary | ICD-10-CM | POA: Diagnosis not present

## 2016-10-26 LAB — CBC WITH DIFFERENTIAL/PLATELET
BASOS ABS: 0 10*3/uL (ref 0–0.1)
BASOS PCT: 0 %
EOS PCT: 0 %
Eosinophils Absolute: 0 10*3/uL (ref 0–0.7)
HCT: 43.8 % (ref 35.0–47.0)
Hemoglobin: 13.9 g/dL (ref 12.0–16.0)
LYMPHS PCT: 6 %
Lymphs Abs: 1 10*3/uL (ref 1.0–3.6)
MCH: 23.5 pg — ABNORMAL LOW (ref 26.0–34.0)
MCHC: 31.7 g/dL — ABNORMAL LOW (ref 32.0–36.0)
MCV: 74.1 fL — AB (ref 80.0–100.0)
MONO ABS: 0.7 10*3/uL (ref 0.2–0.9)
Monocytes Relative: 4 %
Neutro Abs: 15.8 10*3/uL — ABNORMAL HIGH (ref 1.4–6.5)
Neutrophils Relative %: 90 %
PLATELETS: 478 10*3/uL — AB (ref 150–440)
RBC: 5.91 MIL/uL — AB (ref 3.80–5.20)
RDW: 18.3 % — ABNORMAL HIGH (ref 11.5–14.5)
WBC: 17.5 10*3/uL — AB (ref 3.6–11.0)

## 2016-10-26 LAB — BASIC METABOLIC PANEL
ANION GAP: 17 — AB (ref 5–15)
BUN: 26 mg/dL — ABNORMAL HIGH (ref 6–20)
CO2: 21 mmol/L — ABNORMAL LOW (ref 22–32)
Calcium: 10.8 mg/dL — ABNORMAL HIGH (ref 8.9–10.3)
Chloride: 102 mmol/L (ref 101–111)
Creatinine, Ser: 0.78 mg/dL (ref 0.44–1.00)
GLUCOSE: 124 mg/dL — AB (ref 65–99)
Potassium: 3.2 mmol/L — ABNORMAL LOW (ref 3.5–5.1)
SODIUM: 140 mmol/L (ref 135–145)

## 2016-10-26 LAB — MAGNESIUM: Magnesium: 2.5 mg/dL — ABNORMAL HIGH (ref 1.7–2.4)

## 2016-10-26 MED ORDER — PROMETHAZINE HCL 25 MG RE SUPP: 25.0000 mg | Freq: Four times a day (QID) | RECTAL | 1 refills | Status: DC | PRN

## 2016-10-26 MED ORDER — SODIUM CHLORIDE 0.9 % IV BOLUS (SEPSIS)
1000.0000 mL | INTRAVENOUS | Status: AC
  Administered 2016-10-26: 1000 mL via INTRAVENOUS

## 2016-10-26 MED ORDER — POTASSIUM CHLORIDE CRYS ER 20 MEQ PO TBCR
40.0000 meq | EXTENDED_RELEASE_TABLET | Freq: Once | ORAL | Status: AC
  Administered 2016-10-26: 40 meq via ORAL
  Filled 2016-10-26: qty 2

## 2016-10-26 MED ORDER — ONDANSETRON 4 MG PO TBDP
4.0000 mg | ORAL_TABLET | Freq: Once | ORAL | Status: AC
  Administered 2016-10-26: 4 mg via ORAL
  Filled 2016-10-26: qty 1

## 2016-10-26 MED ORDER — NITROFURANTOIN MONOHYD MACRO 100 MG PO CAPS: 100.0000 mg | ORAL_CAPSULE | Freq: Two times a day (BID) | ORAL | 0 refills | Status: AC

## 2016-10-26 MED ORDER — POTASSIUM CHLORIDE CRYS ER 20 MEQ PO TBCR: 20.0000 meq | EXTENDED_RELEASE_TABLET | Freq: Every day | ORAL | 0 refills | Status: DC

## 2016-10-26 NOTE — Discharge Instructions (Signed)
You were evaluated today for nausea and vomiting during pregnancy.  It is safe for you to go home and follow-up as an outpatient since you are feeling better.  Please take your regular medications (unless otherwise canceled in these papers) and any medications prescribed today according to the written instructions.  Return to the emergency department if he develop any new or worsening symptoms that concern you.

## 2016-10-26 NOTE — ED Provider Notes (Signed)
[facility] Emergency Department Provider Note  ____________________________________________   First MD Initiated Contact with Patient 10/26/16 1128     (approximate)  I have reviewed the triage vital signs and the nursing notes.   HISTORY  Chief Complaint Emesis    HPI Shelby Cooper is a 21 y.o. female G2P1 at unknown gestational age but LMP on about 08/24/2016 who presents for evaluation of severe nausea and vomiting.  She states that this has been gradual in onset over the last 2-3 days.  She also states that she vomits so violently that she has abdominal pain afterwards but she does not have abdominal pain all the time.  Occasionally she has some lower abdominal cramping.  She states the symptoms are severe and nothing is making it better.  She found out yesterday with a home pregnancy test that she was pregnant and she came to the emergency department but left without being seen and without getting an ultrasound although she did have labwork done that showed an hCG of about 285,000.  She denies fever/chills, chest pain, shortness of breath.  She has not seen any blood in her emesis.  She has not had any vaginal bleeding or discharge.  She denies dysuria.  She feels terrible all over because of the recurrent vomiting and states she has not been able to keep anything down since yesterday.  She did not have these symptoms with her first child which was born last year; she had a C-section about 10 months ago and went to [facility] for her prenatal care.   Past Medical History:  Diagnosis Date  . Medical history non-contributory     Patient Active Problem List   Diagnosis Date Noted  . Postpartum care following cesarean delivery 01/11/2016  . Amniotic fluid leaking 01/04/2016  . Labor and delivery, indication for care 12/21/2015  . First trimester screening 07/06/2015    Past Surgical History:  Procedure Laterality Date  . CESAREAN SECTION N/A 01/08/2016    Procedure: CESAREAN SECTION;  Surgeon: Vena Austria, MD;  Location: [facility] ORS;  Service: Obstetrics;  Laterality: N/A;  . DENTAL SURGERY      Prior to Admission medications   Medication Sig Start Date End Date Taking? Authorizing Provider  medroxyPROGESTERone (DEPO-PROVERA) 150 MG/ML injection Inject 1 mL (150 mg total) into the muscle once. 01/11/16   Tresea Mall, CNM  nitrofurantoin, macrocrystal-monohydrate, (MACROBID) 100 MG capsule Take 1 capsule (100 mg total) by mouth 2 (two) times daily. 10/26/16 11/02/16  Loleta Rose, MD  ondansetron (ZOFRAN ODT) 4 MG disintegrating tablet Take 1 tablet (4 mg total) by mouth every 8 (eight) hours as needed for nausea or vomiting. 10/25/16   Jene Every, MD  oxyCODONE-acetaminophen (ROXICET) 5-325 MG tablet Take 1-2 tablets by mouth every 4 (four) hours as needed for severe pain. 01/11/16   Tresea Mall, CNM  potassium chloride SA (KLOR-CON M20) 20 MEQ tablet Take 1 tablet (20 mEq total) by mouth daily. 10/26/16   Loleta Rose, MD  Prenatal Vit-Fe Fumarate-FA (PRENATAL MULTIVITAMIN) TABS tablet Take 1 tablet by mouth daily at 12 noon.    [provider]  promethazine (PHENERGAN) 25 MG suppository Place 1 suppository (25 mg total) rectally every 6 (six) hours as needed for nausea. 10/26/16 10/26/17  Loleta Rose, MD    Allergies Penicillins  History reviewed. No pertinent family history.  Social History Social History  Substance Use Topics  . Smoking status: Former Games developer  . Smokeless tobacco: Never Used  .  Alcohol use No    Review of Systems Constitutional: No fever/chills.  Feels ill in general Eyes: No visual changes. ENT: No sore throat. Cardiovascular: Denies chest pain. Respiratory: Denies shortness of breath. Gastrointestinal: Severe intractable vomiting for 2-3 days.  Abdominal pain when she vomits.  Occasional lower abdominal cramping.  No diarrhea, no constipation  Genitourinary: Negative for  dysuria. Musculoskeletal: Negative for back pain. Integumentary: Negative for rash. Neurological: Negative for headaches, focal weakness or numbness.   ____________________________________________   PHYSICAL EXAM:  VITAL SIGNS: ED Triage Vitals  Enc Vitals Group     BP 10/26/16 1110 (!) 126/91     Pulse Rate 10/26/16 1110 96     Resp 10/26/16 1110 18     Temp 10/26/16 1110 97.7 F (36.5 C)     Temp Source 10/26/16 1110 Oral     SpO2 --      Weight 10/26/16 1111 135 lb (61.2 kg)     Height 10/26/16 1111 5\' 1"  (1.549 m)     Head Circumference --      Peak Flow --      Pain Score 10/26/16 1110 10     Pain Loc --      Pain Edu? --      Excl. in GC? --     Constitutional: Alert and oriented. Well appearing and in no acute distress. Eyes: Conjunctivae are normal. PERRL. EOMI. Head: Atraumatic. Nose: No congestion/rhinnorhea. Mouth/Throat: Mucous membranes are moist. Neck: No stridor.  No meningeal signs.   Cardiovascular: Normal rate, regular rhythm. Good peripheral circulation. Grossly normal heart sounds. Respiratory: Normal respiratory effort.  No retractions. Lungs CTAB. Gastrointestinal: Soft With no abdominal tenderness to palpation, nondistended, no guarding Genitourinary: Deferred Musculoskeletal: No lower extremity tenderness nor edema. No gross deformities of extremities. Neurologic:  Normal speech and language. No gross focal neurologic deficits are appreciated.  Skin:  Skin is warm, dry and intact. No rash noted. Psychiatric: Mood and affect are very anxious with an odd affect.  She is sitting up in bed with her hospital gown falling off of her, rocking back and forth, but she will stop and maintaining eye contact with me when I am asking her questions.  In between answering questions she will moan and rocking back and forth   ____________________________________________   LABS (all labs ordered are listed, but only abnormal results are displayed)  Labs  Reviewed  CBC WITH DIFFERENTIAL/PLATELET - Abnormal; Notable for the following:       Result Value   WBC 17.5 (*)    RBC 5.91 (*)    MCV 74.1 (*)    MCH 23.5 (*)    MCHC 31.7 (*)    RDW 18.3 (*)    Platelets 478 (*)    Neutro Abs 15.8 (*)    All other components within normal limits  BASIC METABOLIC PANEL - Abnormal; Notable for the following:    Potassium 3.2 (*)    CO2 21 (*)    Glucose, Bld 124 (*)    BUN 26 (*)    Calcium 10.8 (*)    Anion gap 17 (*)    All other components within normal limits  MAGNESIUM - Abnormal; Notable for the following:    Magnesium 2.5 (*)    All other components within normal limits  URINE CULTURE   ____________________________________________  EKG  None - EKG not ordered by ED physician ____________________________________________  RADIOLOGY   US Ob Comp < 14 Wks  Result Date: 10/26/2016 CLINICAL  DATA:  Nausea and vomiting with lower abdominal pain. Unknown gestational age. Beta HCG of 285,000. EXAM: OBSTETRIC <14 WK ULTRASOUND TECHNIQUE: Transabdominal ultrasound was performed for evaluation of the gestation as well as the maternal uterus and adnexal regions. COMPARISON:  08/10/2015 FINDINGS: Intrauterine gestational sac: Present Yolk sac:  Present Embryo:  Present Cardiac Activity: Present Heart Rate: 175 bpm CRL:   24  mm   9 w 1 d                  US EDC: 05/30/2017 Subchorionic hemorrhage:  None visualized. Maternal uterus/adnexae: Normal appearance of the ovaries. No significant free fluid. IMPRESSION: 1. Intrauterine pregnancy of approximately 9 weeks 1 day with fetal heart rate of 175 beats per minute. 2. No explanation for pain. Electronically Signed   By: Jeronimo GreavesKyle  Talbot M.D.   On: 10/26/2016 12:30    ____________________________________________   PROCEDURES  Critical Care performed: No   Procedure(s) performed:   Procedures   ____________________________________________   INITIAL IMPRESSION / ASSESSMENT AND PLAN / ED  COURSE  Pertinent labs & imaging results that were available during my care of the patient were reviewed by me and considered in my medical decision making (see chart for details).   the patient is having severe nausea and intractable vomiting with a new pregnancy diagnosis.  I will obtain an ultrasound to evaluate how far along she is and to ensure that this is a viable single intrauterine pregnancy.  She is having no vaginal bleeding which is reassuring and there is no indication for a pelvic exam at this time.  Her vital signs are stable which is also reassuring.  Her labs yesterday did show some hypokalemia and I will repeat a basic metabolic panel today to see if her electrolytes have changed given the persistent vomiting.  Of note, she discussed at triage today whether or not she could have an abortion today and after completing the workup we will talk to her about the need for close outpatient follow-up with [facility] or another prenatal provider to have these discussions.   Clinical Course as of Oct 27 1435  Sat Oct 26, 2016  1324 The patient feels better and states that she has a little bit of nausea but it is much better than before.  She has no abdominal pain.  I updated her about the results of the ultrasound.  She reiterated to me that she wants to have an elective abortion and I explained to her that she needs to discuss this with her OB/GYN and encouraged her to follow up on Monday with [facility].  She has a leukocytosis but I suspect this is secondary to the persistent vomiting as she has no signs of infection except that  Her urinalysis yesterday was questionable so I am sending a urine culture and will start her on Keflex given that she is pregnant.  She states that she has Zofran but she is not able to keep it down so I will give her prescription for Phenergan suppositories and explained to her how to use them.  Her anion gap is slightly elevated at 17 but I suspect that this is due to  her persistent vomiting just like her magnesium is a little bit elevated but I suspect that is an issue of hemoconcentration.  She is stable at this point and appropriate for outpatient follow-up at the next available opportunity with OB/GYN.  I have rehydrated her and provided a potassium supplement.  [CF]  1437  Nurse reports patient has received most of 2L of fluids and is feeling better, tolerated drinking ginger ale.  Will discharge for outpatient follow up.  [CF]    Clinical Course User Index [CF] Loleta Rose, MD    ____________________________________________  FINAL CLINICAL IMPRESSION(S) / ED DIAGNOSES  Final diagnoses:  First trimester pregnancy  Nausea and vomiting during pregnancy  Volume depletion, gastrointestinal loss  Hypokalemia, gastrointestinal losses     MEDICATIONS GIVEN DURING THIS VISIT:  Medications  sodium chloride 0.9 % bolus 1,000 mL (1,000 mLs Intravenous New Bag/Given 10/26/16 1155)  ondansetron (ZOFRAN-ODT) disintegrating tablet 4 mg (4 mg Oral Given 10/26/16 1148)  sodium chloride 0.9 % bolus 1,000 mL (1,000 mLs Intravenous New Bag/Given 10/26/16 1343)  potassium chloride SA (K-DUR,KLOR-CON) CR tablet 40 mEq (40 mEq Oral Given 10/26/16 1343)     NEW OUTPATIENT MEDICATIONS STARTED DURING THIS VISIT:  New Prescriptions   NITROFURANTOIN, MACROCRYSTAL-MONOHYDRATE, (MACROBID) 100 MG CAPSULE    Take 1 capsule (100 mg total) by mouth 2 (two) times daily.   POTASSIUM CHLORIDE SA (KLOR-CON M20) 20 MEQ TABLET    Take 1 tablet (20 mEq total) by mouth daily.   PROMETHAZINE (PHENERGAN) 25 MG SUPPOSITORY    Place 1 suppository (25 mg total) rectally every 6 (six) hours as needed for nausea.    Modified Medications   No medications on file    Discontinued Medications   No medications on file     Note:  This document was prepared using Dragon voice recognition software and may include unintentional dictation errors.    Loleta Rose, MD 10/26/16 [phone]

## 2016-10-26 NOTE — ED Notes (Signed)
Patient transported to Ultrasound 

## 2016-10-26 NOTE — ED Triage Notes (Signed)
Pt here yesterday, states she is pregnant and c/o lower abdominal pain and continuous vomiting.  States worse than yesterday. Unable to keep anything down.

## 2016-10-28 ENCOUNTER — Telehealth: Payer: Self-pay | Admitting: Emergency Medicine

## 2016-10-28 LAB — URINE CULTURE: SPECIAL REQUESTS: NORMAL

## 2016-10-28 NOTE — Telephone Encounter (Signed)
Called patient to ask about follow up plans and inform of hcg result available.  Mom is with patient and says patient is still sick she is givig her nausea med.  Tried rectal suppositories, but they just come back out.  Says she has been able to take some liquids, banana and an orange today.  I told her that she really needs to keep the med for uti down, and if she cannot, she should return.  Mom says pt has appt with [facility] on Thursday.

## 2016-10-29 NOTE — ED Provider Notes (Signed)
[facility] Emergency Department Provider Note   ____________________________________________    I have reviewed the triage vital signs and the nursing notes.   HISTORY  Chief Complaint Abdominal Pain and Possible pregnancy     HPI Shelby Cooper is a 21 y.o. female who p/w nausea and vomiting and mild abdominal cramping. She took a pregnancy test and it was positive and reports she had significant n/v with last pregnancy. She wants treatment for nausea. Reports zofran has worked in the past. Denies vaginal bleeding. No fevers/chills. Mild abdominal cramping.   Past Medical History:  Diagnosis Date  . Medical history non-contributory     Patient Active Problem List   Diagnosis Date Noted  . Postpartum care following cesarean delivery 01/11/2016  . Amniotic fluid leaking 01/04/2016  . Labor and delivery, indication for care 12/21/2015  . First trimester screening 07/06/2015    Past Surgical History:  Procedure Laterality Date  . CESAREAN SECTION N/A 01/08/2016   Procedure: CESAREAN SECTION;  Surgeon: Vena Austria, MD;  Location: [facility] ORS;  Service: Obstetrics;  Laterality: N/A;  . DENTAL SURGERY      Prior to Admission medications   Medication Sig Start Date End Date Taking? Authorizing Provider  medroxyPROGESTERone (DEPO-PROVERA) 150 MG/ML injection Inject 1 mL (150 mg total) into the muscle once. 01/11/16   Tresea Mall, CNM  nitrofurantoin, macrocrystal-monohydrate, (MACROBID) 100 MG capsule Take 1 capsule (100 mg total) by mouth 2 (two) times daily. 10/26/16 11/02/16  Loleta Rose, MD  ondansetron (ZOFRAN ODT) 4 MG disintegrating tablet Take 1 tablet (4 mg total) by mouth every 8 (eight) hours as needed for nausea or vomiting. 10/25/16   Jene Every, MD  oxyCODONE-acetaminophen (ROXICET) 5-325 MG tablet Take 1-2 tablets by mouth every 4 (four) hours as needed for severe pain. 01/11/16   Tresea Mall, CNM  potassium chloride SA  (KLOR-CON M20) 20 MEQ tablet Take 1 tablet (20 mEq total) by mouth daily. 10/26/16   Loleta Rose, MD  Prenatal Vit-Fe Fumarate-FA (PRENATAL MULTIVITAMIN) TABS tablet Take 1 tablet by mouth daily at 12 noon.    [provider]  promethazine (PHENERGAN) 25 MG suppository Place 1 suppository (25 mg total) rectally every 6 (six) hours as needed for nausea. 10/26/16 10/26/17  Loleta Rose, MD     Allergies Penicillins  No family history on file.  Social History Social History  Substance Use Topics  . Smoking status: Former Games developer  . Smokeless tobacco: Never Used  . Alcohol use No    Review of Systems  Constitutional: No fever/chills Eyes: No visual changes.  ENT: No sore throat. Cardiovascular: Denies chest pain. Respiratory: Denies shortness of breath. Gastrointestinal: as above   Genitourinary: Negative for dysuria. Musculoskeletal: Negative for back pain. Skin: Negative for rash. Neurological: Negative for headaches   ____________________________________________   PHYSICAL EXAM:  VITAL SIGNS: ED Triage Vitals  Enc Vitals Group     BP 10/25/16 1542 (!) 151/95     Pulse Rate 10/25/16 1542 (!) 105     Resp 10/25/16 1542 20     Temp 10/25/16 1542 98.5 F (36.9 C)     Temp Source 10/25/16 1542 Oral     SpO2 10/25/16 1542 100 %     Weight 10/25/16 1542 135 lb (61.2 kg)     Height 10/25/16 1542 5\' 1"  (1.549 m)     Head Circumference --      Peak Flow --      Pain Score 10/25/16 1541 6  Pain Loc --      Pain Edu? --      Excl. in GC? --     Constitutional: Alert and oriented. No acute distress.  Eyes: Conjunctivae are normal.   Nose: No congestion/rhinnorhea. Mouth/Throat: Mucous membranes are moist.    Cardiovascular: Normal rate, regular rhythm. Grossly normal heart sounds.  Good peripheral circulation. Respiratory: Normal respiratory effort.  No retractions. Lungs CTAB. Gastrointestinal: Soft and nontender. No distention.  No CVA  tenderness. Genitourinary: deferred per patient request Musculoskeletal:  Warm and well perfused Neurologic:  Normal speech and language. No gross focal neurologic deficits are appreciated.  Skin:  Skin is warm, dry and intact. No rash noted. Psychiatric: Mood and affect are normal. Speech and behavior are normal.  ____________________________________________   LABS (all labs ordered are listed, but only abnormal results are displayed)  Labs Reviewed  COMPREHENSIVE METABOLIC PANEL - Abnormal; Notable for the following:       Result Value   Potassium 3.1 (*)    CO2 21 (*)    Total Protein 9.3 (*)    Albumin 5.1 (*)    All other components within normal limits  CBC - Abnormal; Notable for the following:    WBC 11.1 (*)    RBC 5.50 (*)    MCV 74.2 (*)    MCH 23.4 (*)    MCHC 31.6 (*)    RDW 19.4 (*)    All other components within normal limits  URINALYSIS, COMPLETE (UACMP) WITH MICROSCOPIC - Abnormal; Notable for the following:    Color, Urine YELLOW (*)    APPearance CLOUDY (*)    Ketones, ur 80 (*)    Protein, ur 100 (*)    Squamous Epithelial / LPF 6-30 (*)    All other components within normal limits  HCG, QUANTITATIVE, PREGNANCY - Abnormal; Notable for the following:    hCG, Beta Chain, Quant, S 285,780 (*)    All other components within normal limits  POCT PREGNANCY, URINE - Abnormal; Notable for the following:    Preg Test, Ur POSITIVE (*)    All other components within normal limits  LIPASE, BLOOD  POC URINE PREG, ED   ____________________________________________  EKG   ____________________________________________  RADIOLOGY  [place] pending  ____________________________________________   PROCEDURES  Procedure(s) performed: No    Critical Care performed:No ____________________________________________   INITIAL IMPRESSION / ASSESSMENT AND PLAN / ED COURSE  Pertinent labs & imaging results that were available during my care of the patient were  reviewed by me and considered in my medical decision making (see chart for details).  Patient reports n/v. Pregnancy confirmed. She is requesting zofran only, (discussed zofran warnings with her and she agrees). zofran given.   Patient reports improvement after zofran. I recommended [place] given abd discomfort and unknown gestational age.   Patient opted to leave AMA prior to [place] because she did not want to wait and 'feels better'. She understands that we do no know if this could be an ectopic pregnancy or miscarriage and she accepts this risk. She has decisional capacity.     ____________________________________________   FINAL CLINICAL IMPRESSION(S) / ED DIAGNOSES  Final diagnoses:  Pregnancy, unspecified gestational age  Morning sickness      NEW MEDICATIONS STARTED DURING THIS VISIT:  Discharge Medication List as of 10/25/2016  5:37 PM    START taking these medications   Details  ondansetron (ZOFRAN ODT) 4 MG disintegrating tablet Take 1 tablet (4 mg total) by mouth every 8 (  eight) hours as needed for nausea or vomiting., Starting Fri 10/25/2016, Print         Note:  This document was prepared using Dragon voice recognition software and may include unintentional dictation errors.    Jene EveryKinner, Carlyon Nolasco, MD 10/29/16 [id number]

## 2016-11-28 ENCOUNTER — Ambulatory Visit: Payer: Self-pay | Admitting: Obstetrics and Gynecology

## 2018-11-13 ENCOUNTER — Encounter: Payer: Self-pay | Admitting: Maternal Newborn

## 2018-11-13 ENCOUNTER — Other Ambulatory Visit: Payer: Self-pay

## 2018-11-13 ENCOUNTER — Ambulatory Visit (INDEPENDENT_AMBULATORY_CARE_PROVIDER_SITE_OTHER): Payer: Medicaid Other | Admitting: Maternal Newborn

## 2018-11-13 ENCOUNTER — Other Ambulatory Visit (HOSPITAL_COMMUNITY)
Admission: RE | Admit: 2018-11-13 | Discharge: 2018-11-13 | Disposition: A | Discharge status: Home / Self Care | Payer: Medicaid Other | Source: Ambulatory Visit | Attending: Maternal Newborn | Admitting: Maternal Newborn

## 2018-11-13 VITALS — BP 120/74 | Wt 164.2 lb

## 2018-11-13 DIAGNOSIS — Z113 Encounter for screening for infections with a predominantly sexual mode of transmission: Secondary | ICD-10-CM

## 2018-11-13 DIAGNOSIS — Z3689 Encounter for other specified antenatal screening: Secondary | ICD-10-CM

## 2018-11-13 DIAGNOSIS — Z6831 Body mass index (BMI) 31.0-31.9, adult: Secondary | ICD-10-CM

## 2018-11-13 DIAGNOSIS — Z3480 Encounter for supervision of other normal pregnancy, unspecified trimester: Secondary | ICD-10-CM

## 2018-11-13 DIAGNOSIS — Z124 Encounter for screening for malignant neoplasm of cervix: Secondary | ICD-10-CM | POA: Insufficient documentation

## 2018-11-13 DIAGNOSIS — O34219 Maternal care for unspecified type scar from previous cesarean delivery: Secondary | ICD-10-CM | POA: Diagnosis not present

## 2018-11-13 DIAGNOSIS — Z3A27 27 weeks gestation of pregnancy: Secondary | ICD-10-CM | POA: Diagnosis not present

## 2018-11-13 NOTE — Progress Notes (Signed)
NOB- no concerns ?

## 2018-11-13 NOTE — Progress Notes (Signed)
11/13/2018   Chief Complaint: Desires prenatal care.  Transfer of Care Patient: No  History of Present Illness: [name] is a 23 y.o. G3P1011 at [redacted]w[redacted]d based on Patient's last menstrual period on 05/03/2018 (within days), with an Estimated Date of Delivery: 02/07/2019, with the above CC.   Her periods were: irregular periods; last cycle in November was heavy and lasted for 5 days She has Negative signs or symptoms of nausea/vomiting of pregnancy. She has Negative signs or symptoms of miscarriage or preterm labor She identifies Negative Zika risk factors for her and her partner On any different medications around the time she conceived/early pregnancy: No  History of varicella: Yes   Review of Systems  Constitutional: Negative.   HENT: Negative.   Eyes: Negative.   Respiratory: Negative for shortness of breath.   Cardiovascular: Negative for chest pain and palpitations.  Gastrointestinal: Negative for nausea.  Genitourinary: Negative.   Musculoskeletal: Negative.   Skin: Negative.   Neurological: Negative.   Endo/Heme/Allergies: Negative.   Psychiatric/Behavioral: Negative.   Breasts: positive for tenderness   Review of systems was otherwise negative, except as stated in the above HPI.  OBGYN History: As per HPI. OB History  Gravida Para Term Preterm AB Living  3 1 1   1 1   SAB TAB Ectopic Multiple Live Births  1     0 1    # Outcome Date GA Lbr Len/2nd Weight Sex Delivery Anes PTL Lv  3 Current           2 SAB 01/2017          1 Term 01/08/16 [redacted]w[redacted]d  7 lb 11.1 oz (3.49 kg) F CS-LTranv Spinal  LIV    Any issues with any prior pregnancies: yes, Cesarean delivery with G1 for fetal intolerance of labor, SAB with G2 Any prior children are healthy, doing well, without any problems or issues: yes History of pap smears: No History of STIs: No   Past Medical History: Past Medical History:  Diagnosis Date  . Medical history non-contributory     Past Surgical History:  Past Surgical History:  Procedure Laterality Date  . CESAREAN SECTION N/A 01/08/2016   Procedure: CESAREAN SECTION;  Surgeon: Vena Austria, MD;  Location: [facility] ORS;  Service: Obstetrics;  Laterality: N/A;  . DENTAL SURGERY      Family History:  Negative/unremarkable except as detailed in HPI. She denies any female cancers, bleeding or blood clotting disorders.  She denies any history of intellectual disability, birth defects or genetic disorders in her or the FOB's history  Social History:  Social History   Socioeconomic History  . Marital status: Single    Spouse name: Not on file  . Number of children: Not on file  . Years of education: Not on file  . Highest education level: Not on file  Occupational History  . Not on file  Social Needs  . Financial resource strain: Not on file  . Food insecurity:    Worry: Not on file    Inability: Not on file  . Transportation needs:    Medical: Not on file    Non-medical: Not on file  Tobacco Use  . Smoking status: Former Games developer  . Smokeless tobacco: Never Used  Substance and Sexual Activity  . Alcohol use: No  . Drug use: No  . Sexual activity: Yes    Partners: Male  Lifestyle  . Physical activity:    Days per week: Not on file    Minutes  per session: Not on file  . Stress: Not on file  Relationships  . Social connections:    Talks on phone: Not on file    Gets together: Not on file    Attends religious service: Not on file    Active member of club or organization: Not on file    Attends meetings of clubs or organizations: Not on file    Relationship status: Not on file  . Intimate partner violence:    Fear of current or ex partner: Not on file    Emotionally abused: Not on file    Physically abused: Not on file    Forced sexual activity: Not on file  Other Topics Concern  . Not on file  Social History Narrative  . Not on file   Any cats in the household: no Domestic violence screening is negative.  Allergy:  Allergies  Allergen Reactions  . Penicillins Swelling    Current Outpatient Medications: No current outpatient medications on file.   Physical Exam:   Wt 164 lb 3.2 oz (74.5 kg)   LMP 05/03/2018 (Within Days)   BMI 31.03 kg/m  Body mass index is 31.03 kg/m. Constitutional: Well nourished, well developed female in no acute distress.  Neck:  Supple, normal appearance, and no thyromegaly  Cardiovascular: S1, S2 normal, no murmur, rub or gallop, regular rate and rhythm Respiratory:  Clear to auscultation bilaterally. Normal respiratory effort Abdomen: positive bowel sounds and no masses, hernias; diffusely non tender to palpation, non distended Breasts: breasts appear normal, no suspicious masses, no skin or nipple changes or axillary nodes. Neuro/Psych:  Normal mood and affect.  Skin:  Warm and dry.  Lymphatic:  No inguinal lymphadenopathy.   Pelvic exam: is not limited by body habitus External genitalia, Bartholin's glands, Urethra, Skene's glands: within normal limits Vagina: within normal limits and with no blood in the vault  Cervix: normal appearing cervix without discharge or lesions, closed/long/high Uterus:  enlarged, c/w 29 week size Adnexa:  no mass, fullness, tenderness  Assessment: [name] is a 23 y.o. G3P1011 6355w5d based on Patient's last menstrual period was 05/03/2018 (within days). with an Estimated Date of Delivery: 02/07/19, presenting for prenatal care.  Plan:  1) Avoid alcoholic beverages. 2) Patient encouraged not to smoke.  3) Discontinue the use of all non-medicinal drugs and chemicals.  4) Take prenatal vitamins daily.  5) Seatbelt use advised 6) Nutrition, food safety (fish, cheese advisories, and high nitrite foods) and exercise discussed. 7) [facility] discussed  8) Patient is asked about travel to areas at risk for the Zika virus, and counseled to avoid travel and exposure to mosquitoes or sexual partners  who may have themselves been exposed to the virus. Testing is discussed, and will be ordered as appropriate.  9) Genetic Screening, such as with 1st Trimester Screening, cell free fetal DNA, AFP testing, and Ultrasound is discussed with patient. She plans to decline genetic testing this pregnancy. 10) FHT 159 bpm via Doppler. 11) Pap done today. 12) GTT, NOB labs and dating scan ordered.  Problem list reviewed and updated.  Marcelyn BruinsJacelyn Laquida Cotrell, CNM [facility], [facility] 11/13/2018

## 2018-11-14 LAB — URINE DRUG PANEL 7
Amphetamines, Urine: NEGATIVE ng/mL
Barbiturate Quant, Ur: NEGATIVE ng/mL
Benzodiazepine Quant, Ur: NEGATIVE ng/mL
Cannabinoid Quant, Ur: NEGATIVE ng/mL
Cocaine (Metab.): NEGATIVE ng/mL
Opiate Quant, Ur: NEGATIVE ng/mL
PCP Quant, Ur: NEGATIVE ng/mL

## 2018-11-16 LAB — URINE CULTURE

## 2018-11-17 ENCOUNTER — Other Ambulatory Visit: Payer: Self-pay

## 2018-11-17 ENCOUNTER — Ambulatory Visit (INDEPENDENT_AMBULATORY_CARE_PROVIDER_SITE_OTHER): Payer: Medicaid Other

## 2018-11-17 ENCOUNTER — Ambulatory Visit (INDEPENDENT_AMBULATORY_CARE_PROVIDER_SITE_OTHER): Payer: Medicaid Other | Admitting: Obstetrics and Gynecology

## 2018-11-17 ENCOUNTER — Other Ambulatory Visit: Payer: Medicaid Other

## 2018-11-17 ENCOUNTER — Encounter: Payer: Self-pay | Admitting: Obstetrics and Gynecology

## 2018-11-17 VITALS — BP 116/60 | Wt 167.0 lb

## 2018-11-17 DIAGNOSIS — Z3A28 28 weeks gestation of pregnancy: Secondary | ICD-10-CM

## 2018-11-17 DIAGNOSIS — Z3689 Encounter for other specified antenatal screening: Secondary | ICD-10-CM

## 2018-11-17 DIAGNOSIS — Z113 Encounter for screening for infections with a predominantly sexual mode of transmission: Secondary | ICD-10-CM | POA: Diagnosis present

## 2018-11-17 DIAGNOSIS — Z23 Encounter for immunization: Secondary | ICD-10-CM | POA: Diagnosis not present

## 2018-11-17 DIAGNOSIS — Z3483 Encounter for supervision of other normal pregnancy, third trimester: Secondary | ICD-10-CM

## 2018-11-17 DIAGNOSIS — Z363 Encounter for antenatal screening for malformations: Secondary | ICD-10-CM | POA: Diagnosis not present

## 2018-11-17 DIAGNOSIS — Z124 Encounter for screening for malignant neoplasm of cervix: Secondary | ICD-10-CM | POA: Diagnosis present

## 2018-11-17 DIAGNOSIS — Z6831 Body mass index (BMI) 31.0-31.9, adult: Secondary | ICD-10-CM

## 2018-11-17 DIAGNOSIS — Z3480 Encounter for supervision of other normal pregnancy, unspecified trimester: Secondary | ICD-10-CM

## 2018-11-17 NOTE — Progress Notes (Signed)
    Routine Prenatal Care Visit  Subjective  Shelby Cooper is a 23 y.o. G3P1011 at [redacted]w[redacted]d being seen today for ongoing prenatal care.  She is currently monitored for the following issues for this low-risk pregnancy and has Supervision of other normal pregnancy, antepartum on their problem list.  ----------------------------------------------------------------------------------- Patient reports no complaints.   Contractions: Not present. Vag. Bleeding: None.  Movement: Present. Denies leaking of fluid.  ----------------------------------------------------------------------------------- The following portions of the patient's history were reviewed and updated as appropriate: allergies, current medications, past family history, past medical history, past social history, past surgical history and problem list. Problem list updated.   Objective  Blood pressure 116/60, weight 167 lb (75.8 kg), last menstrual period 05/03/2018, unknown if currently breastfeeding. Pregravid weight 150 lb (68 kg) Total Weight Gain 17 lb (7.711 kg) Urinalysis:      Fetal Status: Fetal Heart Rate (bpm): 144   Movement: Present  Presentation: Vertex  General:  Alert, oriented and cooperative. Patient is in no acute distress.  Skin: Skin is warm and dry. No rash noted.   Cardiovascular: Normal heart rate noted  Respiratory: Normal respiratory effort, no problems with respiration noted  Abdomen: Soft, gravid, appropriate for gestational age. Pain/Pressure: Absent     Pelvic:  Cervical exam deferred        Extremities: Normal range of motion.     Mental Status: Normal mood and affect. Normal behavior. Normal judgment and thought content.     Assessment   23 y.o. G3P1011 at [redacted]w[redacted]d by  02/07/2019, by Last Menstrual Period presenting for routine prenatal visit  Plan   THIRD Problems (from 11/13/18 to present)    Problem Noted Resolved   Supervision of other normal pregnancy, antepartum 11/13/2018 by Oswaldo Conroy, CNM No   Overview Addendum 11/17/2018 11:22 AM by Natale Milch, MD      Clinic [facility] Prenatal Labs  Dating  27 wk [place] Blood type:     Genetic Screen Late to care: declines  Antibody:   Anatomic [place]  complete Rubella:   Varicella: @VZVIGG @  GTT  28 wk:      RPR:     Rhogam  not needed HBsAg:     TDaP vaccine     11/17/2018                  HIV:     Flu Shot                                GBS:   Contraception  Pap:  CBB     CS/VBAC  cesarean with 1st, arrested at 7cm & NRFHT   Baby Food    Support Person                Gestational age appropriate obstetric precautions including but not limited to vaginal bleeding, contractions, leaking of fluid and fetal movement were reviewed in detail with the patient.    Declines genetic screening Tdap today EDC adjusted based on today's [place] Considering VBAC vs cesarean delivery   Return in about 2 weeks (around 12/01/2018) for ROB in person.  Natale Milch MD [facility], [facility] 11/17/2018, 11:23 AM

## 2018-11-17 NOTE — Progress Notes (Signed)
ROB C/o Lower back pain  BT/Tdap consent today

## 2018-11-18 LAB — CYTOLOGY - PAP
Chlamydia: NEGATIVE
Diagnosis: NEGATIVE
Neisseria Gonorrhea: NEGATIVE

## 2018-11-19 LAB — RPR+RH+ABO+RUB AB+AB SCR+CB...
Antibody Screen: NEGATIVE
HIV Screen 4th Generation wRfx: NONREACTIVE
Hematocrit: 27.6 % — ABNORMAL LOW (ref 34.0–46.6)
Hemoglobin: 8.9 g/dL — ABNORMAL LOW (ref 11.1–15.9)
Hepatitis B Surface Ag: NEGATIVE
MCH: 26.3 pg — ABNORMAL LOW (ref 26.6–33.0)
MCHC: 32.2 g/dL (ref 31.5–35.7)
MCV: 81 fL (ref 79–97)
Platelets: 415 10*3/uL (ref 150–450)
RBC: 3.39 x10E6/uL — ABNORMAL LOW (ref 3.77–5.28)
RDW: 12.6 % (ref 11.7–15.4)
RPR Ser Ql: NONREACTIVE
Rh Factor: POSITIVE
Rubella Antibodies, IGG: 11.2 index (ref >0.99)
Varicella zoster IgG: 2072 index (ref >165)
WBC: 15.9 10*3/uL — ABNORMAL HIGH (ref 3.4–10.8)

## 2018-11-19 LAB — HEMOGLOBINOPATHY EVALUATION
HGB C: 0 %
HGB S: 0 %
HGB VARIANT: 0 %
Hemoglobin A2 Quantitation: 1.8 % (ref 1.8–3.2)
Hemoglobin F Quantitation: 0 % (ref 0.0–2.0)
Hgb A: 98.2 % (ref 96.4–98.8)

## 2018-11-19 LAB — GLUCOSE, 1 HOUR GESTATIONAL: Gestational Diabetes Screen: 119 mg/dL (ref 65–139)

## 2018-12-01 ENCOUNTER — Ambulatory Visit (INDEPENDENT_AMBULATORY_CARE_PROVIDER_SITE_OTHER): Payer: Medicaid Other | Admitting: Obstetrics & Gynecology

## 2018-12-01 ENCOUNTER — Encounter: Payer: Self-pay | Admitting: Obstetrics & Gynecology

## 2018-12-01 ENCOUNTER — Other Ambulatory Visit: Payer: Self-pay

## 2018-12-01 VITALS — BP 110/60 | Wt 169.0 lb

## 2018-12-01 DIAGNOSIS — Z3A29 29 weeks gestation of pregnancy: Secondary | ICD-10-CM

## 2018-12-01 DIAGNOSIS — Z3480 Encounter for supervision of other normal pregnancy, unspecified trimester: Secondary | ICD-10-CM

## 2018-12-01 DIAGNOSIS — O34219 Maternal care for unspecified type scar from previous cesarean delivery: Secondary | ICD-10-CM

## 2018-12-01 DIAGNOSIS — Z6831 Body mass index (BMI) 31.0-31.9, adult: Secondary | ICD-10-CM

## 2018-12-01 LAB — POCT URINALYSIS DIPSTICK OB: Glucose, UA: NEGATIVE

## 2018-12-01 MED ORDER — FUSION 65-65-25-30 MG PO CAPS: 1.0000 | ORAL_CAPSULE | Freq: Every day | ORAL | 11 refills | Status: DC

## 2018-12-01 NOTE — Progress Notes (Signed)
Subjective  Fetal Movement? yes Contractions? no Leaking Fluid? no Vaginal Bleeding? no  Objective  BP 110/60   Wt 169 lb (76.7 kg)   LMP 05/03/2018 (Within Days)   BMI 31.93 kg/m  General: NAD Pumonary: no increased work of breathing Abdomen: gravid, non-tender Extremities: no edema Psychiatric: mood appropriate, affect full  Assessment  23 y.o. G3P1011 at [redacted]w[redacted]d by  02/15/2019, by Ultrasound presenting for routine prenatal visit  Plan   Problem List Items Addressed This Visit      Other   Supervision of other normal pregnancy, antepartum    Other Visit Diagnoses    [redacted] weeks gestation of pregnancy    -  Primary   Relevant Orders   POC Urinalysis Dipstick OB (Completed)   BMI 31.0-31.9,adult          THIRD Problems (from 11/13/18 to present)    Problem Noted Resolved   Supervision of other normal pregnancy, antepartum 11/13/2018 by Rexene Agent, CNM No   Overview Addendum 12/01/2018 10:57 AM by Gae Dry, MD      Clinic [facility] Prenatal Labs  Dating  27 wk [place] Blood type: AB/Positive/-- (05/26 1144)   Genetic Screen Late to care: declines  Antibody:Negative (05/26 1144)  Anatomic [place]  complete Rubella: 11.20 (05/26 1144) Varicella: @VZVIGG @  GTT  28 wk:    Normal  RPR: Non Reactive (05/26 1144)   Rhogam  not needed HBsAg: Negative (05/26 1144)   TDaP vaccine     11/17/2018                  HIV: Non Reactive (05/26 1144)   Flu Shot                           declines     GBS: p  Contraception Depo vs IUD Pap:10/2018 nml  CBB  no   CS/VBAC  cesarean with 1st, arrested at 7cm & NRFHT, desires repeat CS   Baby Food Bottle   Support Person               OB/GYN  Counseling Note  23 y.o. [id number] at [redacted]w[redacted]d with Estimated Date of Delivery: 02/15/19 was seen today in office to discuss trial of labor after cesarean section (TOLAC) versus elective repeat cesarean delivery (ERCD). The following risks were discussed with the patient.  Risk of uterine rupture at  term is 0.78 percent with TOLAC and 0.22 percent with ERCD. 1 in 10 uterine ruptures will result in neonatal death or neurological injury. The benefits of a trial of labor after cesarean (TOLAC) resulting in a vaginal birth after cesarean (VBAC) include the following: shorter length of hospital stay and postpartum recovery (in most cases); fewer complications, such as postpartum fever, wound or uterine infection, thromboembolism (blood clots in the leg or lung), need for blood transfusion and fewer neonatal breathing problems.  The risks of an attempted VBAC or TOLAC include the following: Risk of failed trial of labor after cesarean (TOLAC) without a vaginal birth after cesarean (VBAC) resulting in repeat cesarean delivery (RCD) in about 20 to 41 percent of women who attempt VBAC.  Risk of rupture of uterus resulting in an emergency cesarean delivery. The risk of uterine rupture may be related in part to the type of uterine incision made during the first cesarean delivery. A previous transverse uterine incision has the lowest risk of rupture (0.2 to 1.5 percent risk). Vertical or T-shaped uterine incisions  have a higher risk of uterine rupture (4 to 9 percent risk)The risk of fetal death is very low with both VBAC and elective repeat cesarean delivery (ERCD), but the likelihood of fetal death is higher with VBAC than with ERCD. Maternal death is very rare with either type of delivery.  The risks of an elective repeat cesarean delivery (ERCD) were reviewed with the patient including but not limited to: 07/998 risk of uterine rupture which could have serious consequences, bleeding which may require transfusion; infection which may require antibiotics; injury to bowel, bladder or other surrounding organs (bowel, bladder, ureters); injury to the fetus; need for additional procedures including hysterectomy in the event of a life-threatening hemorrhage; thromboembolic phenomenon; abnormal placentation; incisional  problems; death and other postoperative or anesthesia complications.    Desires repeat CS 39 + weeks  Iron samples and Rx today.  Recheck later this trimester  Annamarie MajorPaul Allsion Nogales, MD, Merlinda FrederickFACOG [facility], [facility] 12/01/2018  11:08 AM

## 2018-12-01 NOTE — Patient Instructions (Signed)
Third Trimester of Pregnancy The third trimester is from week 28 through week 40 (months 7 through 9). The third trimester is a time when the unborn baby (fetus) is growing rapidly. At the end of the ninth month, the fetus is about 20 inches in length and weighs 6-10 pounds. Body changes during your third trimester Your body will continue to go through many changes during pregnancy. The changes vary from woman to woman. During the third trimester:  Your weight will continue to increase. You can expect to gain 25-35 pounds (11-16 kg) by the end of the pregnancy.  You may begin to get stretch marks on your hips, abdomen, and breasts.  You may urinate more often because the fetus is moving lower into your pelvis and pressing on your bladder.  You may develop or continue to have heartburn. This is caused by increased hormones that slow down muscles in the digestive tract.  You may develop or continue to have constipation because increased hormones slow digestion and cause the muscles that push waste through your intestines to relax.  You may develop hemorrhoids. These are swollen veins (varicose veins) in the rectum that can itch or be painful.  You may develop swollen, bulging veins (varicose veins) in your legs.  You may have increased body aches in the pelvis, back, or thighs. This is due to weight gain and increased hormones that are relaxing your joints.  You may have changes in your hair. These can include thickening of your hair, rapid growth, and changes in texture. Some women also have hair loss during or after pregnancy, or hair that feels dry or thin. Your hair will most likely return to normal after your baby is born.  Your breasts will continue to grow and they will continue to become tender. A yellow fluid (colostrum) may leak from your breasts. This is the first milk you are producing for your baby.  Your belly button may stick out.  You may notice more swelling in your hands,  face, or ankles.  You may have increased tingling or numbness in your hands, arms, and legs. The skin on your belly may also feel numb.  You may feel short of breath because of your expanding uterus.  You may have more problems sleeping. This can be caused by the size of your belly, increased need to urinate, and an increase in your body's metabolism.  You may notice the fetus "dropping," or moving lower in your abdomen (lightening).  You may have increased vaginal discharge.  You may notice your joints feel loose and you may have pain around your pelvic bone. What to expect at prenatal visits You will have prenatal exams every 2 weeks until week 36. Then you will have weekly prenatal exams. During a routine prenatal visit:  You will be weighed to make sure you and the baby are growing normally.  Your blood pressure will be taken.  Your abdomen will be measured to track your baby's growth.  The fetal heartbeat will be listened to.  Any test results from the previous visit will be discussed.  You may have a cervical check near your due date to see if your cervix has softened or thinned (effaced).  You will be tested for Group B streptococcus. This happens between 35 and 37 weeks. Your health care provider may ask you:  What your birth plan is.  How you are feeling.  If you are feeling the baby move.  If you have had any abnormal   symptoms, such as leaking fluid, bleeding, severe headaches, or abdominal cramping.  If you are using any tobacco products, including cigarettes, chewing tobacco, and electronic cigarettes.  If you have any questions. Other tests or screenings that may be performed during your third trimester include:  Blood tests that check for low iron levels (anemia).  Fetal testing to check the health, activity level, and growth of the fetus. Testing is done if you have certain medical conditions or if there are problems during the pregnancy.  Nonstress test  (NST). This test checks the health of your baby to make sure there are no signs of problems, such as the baby not getting enough oxygen. During this test, a belt is placed around your belly. The baby is made to move, and its heart rate is monitored during movement. What is false labor? False labor is a condition in which you feel small, irregular tightenings of the muscles in the womb (contractions) that usually go away with rest, changing position, or drinking water. These are called Braxton Hicks contractions. Contractions may last for hours, days, or even weeks before true labor sets in. If contractions come at regular intervals, become more frequent, increase in intensity, or become painful, you should see your health care provider. What are the signs of labor?  Abdominal cramps.  Regular contractions that start at 10 minutes apart and become stronger and more frequent with time.  Contractions that start on the top of the uterus and spread down to the lower abdomen and back.  Increased pelvic pressure and dull back pain.  A watery or bloody mucus discharge that comes from the vagina.  Leaking of amniotic fluid. This is also known as your "water breaking." It could be a slow trickle or a gush. Let your health care provider know if it has a color or strange odor. If you have any of these signs, call your health care provider right away, even if it is before your due date. Follow these instructions at home: Medicines  Follow your health care provider's instructions regarding medicine use. Specific medicines may be either safe or unsafe to take during pregnancy.  Take a prenatal vitamin that contains at least 600 micrograms (mcg) of folic acid.  If you develop constipation, try taking a stool softener if your health care provider approves. Eating and drinking   Eat a balanced diet that includes fresh fruits and vegetables, whole grains, good sources of protein such as meat, eggs, or tofu,  and low-fat dairy. Your health care provider will help you determine the amount of weight gain that is right for you.  Avoid raw meat and uncooked cheese. These carry germs that can cause birth defects in the baby.  If you have low calcium intake from food, talk to your health care provider about whether you should take a daily calcium supplement.  Eat four or five small meals rather than three large meals a day.  Limit foods that are high in fat and processed sugars, such as fried and sweet foods.  To prevent constipation: ? Drink enough fluid to keep your urine clear or pale yellow. ? Eat foods that are high in fiber, such as fresh fruits and vegetables, whole grains, and beans. Activity  Exercise only as directed by your health care provider. Most women can continue their usual exercise routine during pregnancy. Try to exercise for 30 minutes at least 5 days a week. Stop exercising if you experience uterine contractions.  Avoid heavy lifting.  Do   not exercise in extreme heat or humidity, or at high altitudes.  Wear low-heel, comfortable shoes.  Practice good posture.  You may continue to have sex unless your health care provider tells you otherwise. Relieving pain and discomfort  Take frequent breaks and rest with your legs elevated if you have leg cramps or low back pain.  Take warm sitz baths to soothe any pain or discomfort caused by hemorrhoids. Use hemorrhoid cream if your health care provider approves.  Wear a good support bra to prevent discomfort from breast tenderness.  If you develop varicose veins: ? Wear support pantyhose or compression stockings as told by your healthcare provider. ? Elevate your feet for 15 minutes, 3-4 times a day. Prenatal care  Write down your questions. Take them to your prenatal visits.  Keep all your prenatal visits as told by your health care provider. This is important. Safety  Wear your seat belt at all times when driving.  Make  a list of emergency phone numbers, including numbers for family, friends, the hospital, and police and fire departments. General instructions  Avoid cat litter boxes and soil used by cats. These carry germs that can cause birth defects in the baby. If you have a cat, ask someone to clean the litter box for you.  Do not travel far distances unless it is absolutely necessary and only with the approval of your health care provider.  Do not use hot tubs, steam rooms, or saunas.  Do not drink alcohol.  Do not use any products that contain nicotine or tobacco, such as cigarettes and e-cigarettes. If you need help quitting, ask your health care provider.  Do not use any medicinal herbs or unprescribed drugs. These chemicals affect the formation and growth of the baby.  Do not douche or use tampons or scented sanitary pads.  Do not cross your legs for long periods of time.  To prepare for the arrival of your baby: ? Take prenatal classes to understand, practice, and ask questions about labor and delivery. ? Make a trial run to the hospital. ? Visit the hospital and tour the maternity area. ? Arrange for maternity or paternity leave through employers. ? Arrange for family and friends to take care of pets while you are in the hospital. ? Purchase a rear-facing car seat and make sure you know how to install it in your car. ? Pack your hospital bag. ? Prepare the baby's nursery. Make sure to remove all pillows and stuffed animals from the baby's crib to prevent suffocation.  Visit your dentist if you have not gone during your pregnancy. Use a soft toothbrush to brush your teeth and be gentle when you floss. Contact a health care provider if:  You are unsure if you are in labor or if your water has broken.  You become dizzy.  You have mild pelvic cramps, pelvic pressure, or nagging pain in your abdominal area.  You have lower back pain.  You have persistent nausea, vomiting, or  diarrhea.  You have an unusual or bad smelling vaginal discharge.  You have pain when you urinate. Get help right away if:  Your water breaks before 37 weeks.  You have regular contractions less than 5 minutes apart before 37 weeks.  You have a fever.  You are leaking fluid from your vagina.  You have spotting or bleeding from your vagina.  You have severe abdominal pain or cramping.  You have rapid weight loss or weight gain.  You have   shortness of breath with chest pain.  You notice sudden or extreme swelling of your face, hands, ankles, feet, or legs.  Your baby makes fewer than 10 movements in 2 hours.  You have severe headaches that do not go away when you take medicine.  You have vision changes. Summary  The third trimester is from week 28 through week 40, months 7 through 9. The third trimester is a time when the unborn baby (fetus) is growing rapidly.  During the third trimester, your discomfort may increase as you and your baby continue to gain weight. You may have abdominal, leg, and back pain, sleeping problems, and an increased need to urinate.  During the third trimester your breasts will keep growing and they will continue to become tender. A yellow fluid (colostrum) may leak from your breasts. This is the first milk you are producing for your baby.  False labor is a condition in which you feel small, irregular tightenings of the muscles in the womb (contractions) that eventually go away. These are called Braxton Hicks contractions. Contractions may last for hours, days, or even weeks before true labor sets in.  Signs of labor can include: abdominal cramps; regular contractions that start at 10 minutes apart and become stronger and more frequent with time; watery or bloody mucus discharge that comes from the vagina; increased pelvic pressure and dull back pain; and leaking of amniotic fluid. This information is not intended to replace advice given to you by your  health care provider. Make sure you discuss any questions you have with your health care provider. Document Released: 06/04/2001 Document Revised: 07/16/2016 Document Reviewed: 07/16/2016 Elsevier Interactive Patient Education  2019 [employer]  

## 2018-12-15 ENCOUNTER — Other Ambulatory Visit: Payer: Self-pay

## 2018-12-15 ENCOUNTER — Encounter: Payer: Medicaid Other | Admitting: Certified Nurse Midwife

## 2018-12-16 ENCOUNTER — Other Ambulatory Visit: Payer: Self-pay

## 2018-12-16 ENCOUNTER — Telehealth: Payer: Self-pay | Admitting: Obstetrics and Gynecology

## 2018-12-16 ENCOUNTER — Encounter: Payer: Self-pay | Admitting: Obstetrics and Gynecology

## 2018-12-16 ENCOUNTER — Ambulatory Visit (INDEPENDENT_AMBULATORY_CARE_PROVIDER_SITE_OTHER): Payer: Medicaid Other | Admitting: Obstetrics and Gynecology

## 2018-12-16 VITALS — Wt 171.0 lb

## 2018-12-16 DIAGNOSIS — Z3A31 31 weeks gestation of pregnancy: Secondary | ICD-10-CM | POA: Diagnosis not present

## 2018-12-16 DIAGNOSIS — O99013 Anemia complicating pregnancy, third trimester: Secondary | ICD-10-CM | POA: Diagnosis not present

## 2018-12-16 DIAGNOSIS — Z3480 Encounter for supervision of other normal pregnancy, unspecified trimester: Secondary | ICD-10-CM

## 2018-12-16 NOTE — Progress Notes (Signed)
Routine Prenatal Care Visit- Virtual Visit  Subjective   Virtual Visit via Telephone Note  I connected with Shelby Cooper  on 12/16/18 at  9:30 AM EDT by telephone and verified that I am speaking with the correct person using two identifiers.   I discussed the limitations, risks, security and privacy concerns of performing an evaluation and management service by telephone and the availability of in person appointments. I also discussed with the patient that there may be a patient responsible charge related to this service. The patient expressed understanding and agreed to proceed.  The patient was at home I spoke with the patient from my  office The names of people involved in this encounter were: [place] and Dr. Jerene PitchSchuman.   Shelby Cooper is a 23 y.o. G3P1011 at 2755w2d being seen today for ongoing prenatal care.  She is currently monitored for the following issues for this low-risk pregnancy and has Supervision of other normal pregnancy, antepartum on their problem list.  ----------------------------------------------------------------------------------- Patient reports no complaints.   Contractions: Not present. Vag. Bleeding: None.  Movement: Present. Denies leaking of fluid.  ----------------------------------------------------------------------------------- The following portions of the patient's history were reviewed and updated as appropriate: allergies, current medications, past family history, past medical history, past social history, past surgical history and problem list. Problem list updated.   Objective  Weight 171 lb (77.6 kg), last menstrual period 05/03/2018, unknown if currently breastfeeding. Pregravid weight 150 lb (68 kg) Total Weight Gain 21 lb (9.526 kg) Urinalysis:      Fetal Status:     Movement: Present     Physical Exam could not be performed. Because of the COVID-19 outbreak this visit was performed over the phone and not in person.   Assessment   23  y.o. G3P1011 at 1655w2d by  02/15/2019, by Ultrasound presenting for routine prenatal visit  Plan   THIRD Problems (from 11/13/18 to present)    Problem Noted Resolved   Supervision of other normal pregnancy, antepartum 11/13/2018 by Oswaldo ConroySchmid, Jacelyn Y, CNM No   Overview Addendum 12/01/2018 10:57 AM by Nadara MustardHarris, Robert P, MD      Clinic [facility] Prenatal Labs  Dating  27 wk US Blood type: AB/Positive/-- (05/26 1144)   Genetic Screen Late to care: declines  Antibody:Negative (05/26 1144)  Anatomic US  complete Rubella: 11.20 (05/26 1144) Varicella: immune  GTT  28 wk:    Normal  RPR: Non Reactive (05/26 1144)   Rhogam  not needed HBsAg: Negative (05/26 1144)   TDaP vaccine     11/17/2018                  HIV: Non Reactive (05/26 1144)   Flu Shot                           declines     GBS:   Contraception  Pap:10/2018 nml  CBB  no   CS/VBAC  cesarean with 1st, arrested at 7cm & NRFHT   Baby Food    Support Person                 Gestational age appropriate obstetric precautions including but not limited to vaginal bleeding, contractions, leaking of fluid and fetal movement were reviewed in detail with the patient.     Cesarean scheduled with Dr. Bonney AidStaebler  Follow Up Instructions: ROB in 2 weeks in person   I discussed the assessment and treatment plan with the patient.  The patient was provided an opportunity to ask questions and all were answered. The patient agreed with the plan and demonstrated an understanding of the instructions.   The patient was advised to call back or seek an in-person evaluation if the symptoms worsen or if the condition fails to improve as anticipated.  I provided 10 minutes of non-face-to-face time during this encounter.  Return in about 2 weeks (around 12/30/2018).  Adrian Prows MD [facility], [facility] 12/16/2018 10:11 AM

## 2018-12-16 NOTE — Telephone Encounter (Signed)
-----   Message from Homero Fellers, MD sent at 12/16/2018 10:07 AM EDT ----- Surgery Booking Request Patient Full Name:  Arlee Bossard  MRN: [id number]  DOB: August 26, 1995  Surgeon: Georgianne Fick MD  Requested Surgery Date and Time: 02/09/2019 Primary Diagnosis AND Code: hx of prior LTCS Secondary Diagnosis and Code:  Surgical Procedure: Cesarean Section L&D Notification: Yes Admission Status: surgery admit Length of Surgery: 1 hour Special Case Needs: none H&P: TBD (date) Phone Interview???: no Interpreter: Language:  Medical Clearance: no Special Scheduling Instructions: to be scheduled with Staebler at patient's request Acuity: P3  ?

## 2018-12-16 NOTE — Progress Notes (Signed)
ROB No concerns  Good FM

## 2018-12-16 NOTE — Telephone Encounter (Signed)
Lmtrc

## 2018-12-18 ENCOUNTER — Inpatient Hospital Stay: Payer: Medicaid Other | Attending: Oncology | Admitting: Oncology

## 2018-12-22 NOTE — Telephone Encounter (Signed)
Lmtrc

## 2018-12-23 NOTE — Telephone Encounter (Signed)
Patient is aware of H&P on 02/02/19 @ 10:10am, Preadmit Testing and COVID testing to be scheduled on 02/05/19, and OR on 02/09/19. Patient is aware she will be asked to quarantine after COVID testing.

## 2018-12-30 ENCOUNTER — Encounter: Payer: Self-pay | Admitting: Certified Nurse Midwife

## 2018-12-30 ENCOUNTER — Other Ambulatory Visit: Payer: Self-pay

## 2018-12-30 ENCOUNTER — Encounter: Payer: Medicaid Other | Admitting: Maternal Newborn

## 2018-12-30 ENCOUNTER — Ambulatory Visit (INDEPENDENT_AMBULATORY_CARE_PROVIDER_SITE_OTHER): Payer: Medicaid Other | Admitting: Certified Nurse Midwife

## 2018-12-30 VITALS — BP 110/60 | HR 131 | Wt 175.0 lb

## 2018-12-30 DIAGNOSIS — O26843 Uterine size-date discrepancy, third trimester: Secondary | ICD-10-CM

## 2018-12-30 DIAGNOSIS — O99013 Anemia complicating pregnancy, third trimester: Secondary | ICD-10-CM | POA: Insufficient documentation

## 2018-12-30 DIAGNOSIS — Z3480 Encounter for supervision of other normal pregnancy, unspecified trimester: Secondary | ICD-10-CM

## 2018-12-30 DIAGNOSIS — O34219 Maternal care for unspecified type scar from previous cesarean delivery: Secondary | ICD-10-CM | POA: Insufficient documentation

## 2018-12-30 DIAGNOSIS — Z3A33 33 weeks gestation of pregnancy: Secondary | ICD-10-CM

## 2018-12-30 LAB — POCT URINALYSIS DIPSTICK OB: Glucose, UA: NEGATIVE

## 2018-12-30 NOTE — Progress Notes (Signed)
ROB at 33wk2d: Good fetal movement. Increased pelvic pressure. Denies contractions/ vaginal bleeding Prior Cesarean section: repeat scheduled for 8/18 Anemia: Taking iron. Last H&H 8.9/ 27.8. Could not attend hematology consult due to no one for child care. Bottle feeding/ Depo Exam: FH 40 cm/ FHTs WNL. Patient's pulse 131 BPM on check in, 120 with rest A: IUP at 33wk 2day with S>D Anemia Prior CS P: CBC and anemia panel today Continue iron supplements Growth scan in 2 weeks with ROB 34 week precautions/ [place] instructions Dalia Heading, CNM

## 2018-12-30 NOTE — Progress Notes (Signed)
ROB C/o pelvic pressure Denies lof, no vb Good FM 

## 2018-12-31 LAB — CBC WITH DIFFERENTIAL/PLATELET
Basophils Absolute: 0 10*3/uL (ref 0.0–0.2)
Basos: 0 %
EOS (ABSOLUTE): 0.2 10*3/uL (ref 0.0–0.4)
Eos: 1 %
Hematocrit: 27.6 % — ABNORMAL LOW (ref 34.0–46.6)
Hemoglobin: 9.2 g/dL — ABNORMAL LOW (ref 11.1–15.9)
Immature Grans (Abs): 0.4 10*3/uL — ABNORMAL HIGH (ref 0.0–0.1)
Immature Granulocytes: 2 %
Lymphocytes Absolute: 2.7 10*3/uL (ref 0.7–3.1)
Lymphs: 17 %
MCH: 27.3 pg (ref 26.6–33.0)
MCHC: 33.3 g/dL (ref 31.5–35.7)
MCV: 82 fL (ref 79–97)
Monocytes Absolute: 1.1 10*3/uL — ABNORMAL HIGH (ref 0.1–0.9)
Monocytes: 7 %
Neutrophils Absolute: 12.1 10*3/uL — ABNORMAL HIGH (ref 1.4–7.0)
Neutrophils: 73 %
Platelets: 445 10*3/uL (ref 150–450)
RBC: 3.37 x10E6/uL — ABNORMAL LOW (ref 3.77–5.28)
RDW: 13.7 % (ref 11.7–15.4)
WBC: 16.5 10*3/uL — ABNORMAL HIGH (ref 3.4–10.8)

## 2018-12-31 LAB — IRON,TIBC AND FERRITIN PANEL
Ferritin: 8 ng/mL — ABNORMAL LOW (ref 15–150)
Iron Saturation: 39 % (ref 15–55)
Iron: 185 ug/dL — ABNORMAL HIGH (ref 27–159)
Total Iron Binding Capacity: 478 ug/dL — ABNORMAL HIGH (ref 250–450)
UIBC: 293 ug/dL (ref 131–425)

## 2018-12-31 LAB — B12 AND FOLATE PANEL
Folate: >20 ng/mL (ref >3.0)
Vitamin B-12: 267 pg/mL (ref 232–1245)

## 2019-01-13 ENCOUNTER — Other Ambulatory Visit: Payer: Self-pay

## 2019-01-13 ENCOUNTER — Encounter: Payer: Self-pay | Admitting: Obstetrics and Gynecology

## 2019-01-13 ENCOUNTER — Ambulatory Visit (INDEPENDENT_AMBULATORY_CARE_PROVIDER_SITE_OTHER): Payer: Medicaid Other | Admitting: Obstetrics and Gynecology

## 2019-01-13 ENCOUNTER — Ambulatory Visit (INDEPENDENT_AMBULATORY_CARE_PROVIDER_SITE_OTHER): Payer: Medicaid Other

## 2019-01-13 VITALS — BP 122/82 | Wt 178.0 lb

## 2019-01-13 DIAGNOSIS — O26843 Uterine size-date discrepancy, third trimester: Secondary | ICD-10-CM | POA: Diagnosis not present

## 2019-01-13 DIAGNOSIS — O99013 Anemia complicating pregnancy, third trimester: Secondary | ICD-10-CM

## 2019-01-13 DIAGNOSIS — Z3480 Encounter for supervision of other normal pregnancy, unspecified trimester: Secondary | ICD-10-CM

## 2019-01-13 DIAGNOSIS — O34219 Maternal care for unspecified type scar from previous cesarean delivery: Secondary | ICD-10-CM

## 2019-01-13 NOTE — Progress Notes (Signed)
Routine Prenatal Care Visit  Subjective  Shelby Cooper is a 23 y.o. G3P1011 at 729w2d being seen today for ongoing prenatal care.  She is currently monitored for the following issues for this low-risk pregnancy and has Supervision of other normal pregnancy, antepartum; Anemia of mother in pregnancy, antepartum, third trimester; and Previous cesarean section complicating pregnancy on their problem list.  ----------------------------------------------------------------------------------- Patient reports no complaints.   Contractions: Not present. Vag. Bleeding: None.  Movement: Present. Denies leaking of fluid.  U/S shows growth 54th %ile, AFI 22 cm. ----------------------------------------------------------------------------------- The following portions of the patient's history were reviewed and updated as appropriate: allergies, current medications, past family history, past medical history, past social history, past surgical history and problem list. Problem list updated.   Objective  Blood pressure 122/82, weight 178 lb (80.7 kg), last menstrual period 05/03/2018, unknown if currently breastfeeding. Pregravid weight 150 lb (68 kg) Total Weight Gain 28 lb (12.7 kg) Urinalysis: Urine Protein    Urine Glucose    Fetal Status: Fetal Heart Rate (bpm): 160   Movement: Present     General:  Alert, oriented and cooperative. Patient is in no acute distress.  Skin: Skin is warm and dry. No rash noted.   Cardiovascular: Normal heart rate noted  Respiratory: Normal respiratory effort, no problems with respiration noted  Abdomen: Soft, gravid, appropriate for gestational age. Pain/Pressure: Absent     Pelvic:  Cervical exam deferred        Extremities: Normal range of motion.  Edema: None  Mental Status: Normal mood and affect. Normal behavior. Normal judgment and thought content.   Imaging Results [facility] Up  Result Date: 01/13/2019 Patient Name: Shelby BlaseKatana Kester DOB: 04/16/1996 MRN:  [id number] ULTRASOUND REPORT Location: [facility] Date of Service: 01/13/2019 Indications:growth/afi Findings: Mason JimSingleton intrauterine pregnancy is visualized with FHR at 163 BPM. Biometrics give an (U/S) Gestational age of 708w1d and an (U/S) EDD of 02/16/2019; this correlates with the clinically established Estimated Date of Delivery: 02/15/2019. Fetal presentation is Cephalic. Placenta: posterior. Grade: 2 AFI: 22.5 cm Growth percentile is 53.7. EFW: 2,741 g (6 lb 1 oz) Impression: 1. [redacted]w[redacted]d Viable Singleton Intrauterine pregnancy previously established criteria. 2. Growth is 53.7 %ile.  AFI is 22.5 cm. Deanna ArtisElyse S Fairbanks, RT The ultrasound images and findings were reviewed by me and I agree with the above report. Thomasene MohairStephen Trashawn Oquendo, MD, Merlinda FrederickFACOG [facility], [facility] 01/13/2019 3:43 PM       Assessment   23 y.o. G3P1011 at 4629w2d by  02/15/2019, by Ultrasound presenting for routine prenatal visit  Plan   THIRD Problems (from 11/13/18 to present)    Problem Noted Resolved   Supervision of other normal pregnancy, antepartum 11/13/2018 by Oswaldo ConroySchmid, Jacelyn Y, CNM No   Overview Addendum 12/30/2018  8:44 PM by Farrel ConnersGutierrez, Colleen, CNM      Clinic [facility] Prenatal Labs  Dating  27 wk US Blood type: AB/Positive/-- (05/26 1144)   Genetic Screen Late to care: declines  Antibody:Negative (05/26 1144)  Anatomic US  complete Rubella: 11.20 (05/26 1144) Varicella: @VZVIGG @  GTT  28 wk:    Normal  RPR: Non Reactive (05/26 1144)   Rhogam  not needed HBsAg: Negative (05/26 1144)   TDaP vaccine     11/17/2018                  HIV: Non Reactive (05/26 1144)   Flu Shot  declines     GBS:   Contraception Depo Pap:10/2018 nml  CBB  no   CS/VBAC  cesarean with 1st, arrested at 7cm & NRFHT Scheduled 8/18 w/ [place] Person                 Preterm labor symptoms and general obstetric precautions including but not limited to vaginal bleeding,  contractions, leaking of fluid and fetal movement were reviewed in detail with the patient. Please refer to After Visit Summary for other counseling recommendations.   Return in about 1 week (around 01/20/2019) for Routine Prenatal Appointment.  Prentice Docker, MD, [facility], [facility] 01/13/2019 3:51 PM

## 2019-01-20 ENCOUNTER — Other Ambulatory Visit (HOSPITAL_COMMUNITY)
Admission: RE | Admit: 2019-01-20 | Discharge: 2019-01-20 | Disposition: A | Discharge status: Home / Self Care | Payer: Medicaid Other | Source: Ambulatory Visit | Attending: Advanced Practice Midwife | Admitting: Advanced Practice Midwife

## 2019-01-20 ENCOUNTER — Encounter: Payer: Self-pay | Admitting: Advanced Practice Midwife

## 2019-01-20 ENCOUNTER — Other Ambulatory Visit: Payer: Self-pay

## 2019-01-20 ENCOUNTER — Ambulatory Visit (INDEPENDENT_AMBULATORY_CARE_PROVIDER_SITE_OTHER): Payer: Medicaid Other | Admitting: Advanced Practice Midwife

## 2019-01-20 VITALS — BP 124/78 | Wt 180.0 lb

## 2019-01-20 DIAGNOSIS — Z113 Encounter for screening for infections with a predominantly sexual mode of transmission: Secondary | ICD-10-CM | POA: Diagnosis present

## 2019-01-20 DIAGNOSIS — Z3A36 36 weeks gestation of pregnancy: Secondary | ICD-10-CM | POA: Diagnosis not present

## 2019-01-20 DIAGNOSIS — O99013 Anemia complicating pregnancy, third trimester: Secondary | ICD-10-CM

## 2019-01-20 DIAGNOSIS — Z3685 Encounter for antenatal screening for Streptococcus B: Secondary | ICD-10-CM

## 2019-01-20 DIAGNOSIS — O34219 Maternal care for unspecified type scar from previous cesarean delivery: Secondary | ICD-10-CM

## 2019-01-20 NOTE — Progress Notes (Signed)
  Routine Prenatal Care Visit  Subjective  Shelby Cooper is a 23 y.o. G3P1011 at [redacted]w[redacted]d being seen today for ongoing prenatal care.  She is currently monitored for the following issues for this high-risk pregnancy and has Supervision of other normal pregnancy, antepartum; Anemia of mother in pregnancy, antepartum, third trimester; and Previous cesarean section complicating pregnancy on their problem list.  ----------------------------------------------------------------------------------- Patient reports low backache and trouble sleeping.   Contractions: Not present. Vag. Bleeding: None.  Movement: Present. Denies leaking of fluid.  ----------------------------------------------------------------------------------- The following portions of the patient's history were reviewed and updated as appropriate: allergies, current medications, past family history, past medical history, past social history, past surgical history and problem list. Problem list updated.   Objective  Blood pressure 124/78, weight 180 lb (81.6 kg), last menstrual period 05/03/2018 Pregravid weight 150 lb (68 kg) Total Weight Gain 30 lb (13.6 kg) Urinalysis: Urine Protein    Urine Glucose    Fetal Status: Fetal Heart Rate (bpm): 149 Fundal Height: 39 cm Movement: Present     General:  Alert, oriented and cooperative. Patient is in no acute distress.  Skin: Skin is warm and dry. No rash noted.   Cardiovascular: Normal heart rate noted  Respiratory: Normal respiratory effort, no problems with respiration noted  Abdomen: Soft, gravid, appropriate for gestational age. Pain/Pressure: Present     Pelvic:  Cervical exam performed Dilation: Closed    GBS/aptima collected  Extremities: Normal range of motion.  Edema: None  Mental Status: Normal mood and affect. Normal behavior. Normal judgment and thought content.   Assessment   23 y.o. G3P1011 at [redacted]w[redacted]d by  02/15/2019, by Ultrasound presenting for routine prenatal visit   Plan   THIRD Problems (from 11/13/18 to present)    Problem Noted Resolved   Supervision of other normal pregnancy, antepartum 11/13/2018 by Rexene Agent, CNM No   Overview Addendum 12/30/2018  8:44 PM by Dalia Heading, [place] Prenatal Labs  Dating  27 wk [place] Blood type: AB/Positive/-- (05/26 1144)   Genetic Screen Late to care: declines  Antibody:Negative (05/26 1144)  Anatomic [place]  complete Rubella: 11.20 (05/26 1144) Varicella: @VZVIGG @  GTT  28 wk:    Normal  RPR: Non Reactive (05/26 1144)   Rhogam  not needed HBsAg: Negative (05/26 1144)   TDaP vaccine     11/17/2018                  HIV: Non Reactive (05/26 1144)   Flu Shot                           declines     GBS:   Contraception Depo Pap:10/2018 nml  CBB  no   CS/VBAC  cesarean with 1st, arrested at 7cm & NRFHT Scheduled 8/18 w/ [place] Person                 Preterm labor symptoms and general obstetric precautions including but not limited to vaginal bleeding, contractions, leaking of fluid and fetal movement were reviewed in detail with the patient. Please refer to After Visit Summary for other counseling recommendations.   Return in about 1 week (around 01/27/2019) for rob.  Rod Can, CNM 01/20/2019 11:43 AM

## 2019-01-20 NOTE — Progress Notes (Signed)
GBS/Aptima today. No vb. No lof.  

## 2019-01-23 LAB — CERVICOVAGINAL ANCILLARY ONLY
Chlamydia: NEGATIVE
Neisseria Gonorrhea: NEGATIVE
Trichomonas: NEGATIVE

## 2019-01-24 LAB — STREP GP B CULTURE+RFLX: Strep Gp B Culture+Rflx: NEGATIVE

## 2019-01-27 ENCOUNTER — Other Ambulatory Visit: Payer: Self-pay

## 2019-01-27 ENCOUNTER — Ambulatory Visit (INDEPENDENT_AMBULATORY_CARE_PROVIDER_SITE_OTHER): Payer: Medicaid Other | Admitting: Obstetrics and Gynecology

## 2019-01-27 VITALS — BP 128/70 | Wt 181.0 lb

## 2019-01-27 DIAGNOSIS — Z3480 Encounter for supervision of other normal pregnancy, unspecified trimester: Secondary | ICD-10-CM

## 2019-01-27 DIAGNOSIS — Z3A37 37 weeks gestation of pregnancy: Secondary | ICD-10-CM

## 2019-01-27 DIAGNOSIS — O99013 Anemia complicating pregnancy, third trimester: Secondary | ICD-10-CM

## 2019-01-27 DIAGNOSIS — O34219 Maternal care for unspecified type scar from previous cesarean delivery: Secondary | ICD-10-CM

## 2019-01-27 LAB — POCT URINALYSIS DIPSTICK OB: Glucose, UA: NEGATIVE

## 2019-01-27 NOTE — Progress Notes (Signed)
ROB

## 2019-01-27 NOTE — Progress Notes (Signed)
    Routine Prenatal Care Visit  Subjective  Shelby Cooper is a 23 y.o. G3P1011 at [redacted]w[redacted]d being seen today for ongoing prenatal care.  She is currently monitored for the following issues for this low-risk pregnancy and has Supervision of other normal pregnancy, antepartum; Anemia of mother in pregnancy, antepartum, third trimester; and Previous cesarean section complicating pregnancy on their problem list.  ----------------------------------------------------------------------------------- Patient reports no complaints.   Contractions: Not present. Vag. Bleeding: None.  Movement: Present. Denies leaking of fluid.  ----------------------------------------------------------------------------------- The following portions of the patient's history were reviewed and updated as appropriate: allergies, current medications, past family history, past medical history, past social history, past surgical history and problem list. Problem list updated.   Objective  Blood pressure 128/70, weight 181 lb (82.1 kg), last menstrual period 05/03/2018, unknown if currently breastfeeding. Pregravid weight 150 lb (68 kg) Total Weight Gain 31 lb (14.1 kg) Urinalysis:      Fetal Status: Fetal Heart Rate (bpm): 140 Fundal Height: 40 cm Movement: Present  Presentation: Vertex  General:  Alert, oriented and cooperative. Patient is in no acute distress.  Skin: Skin is warm and dry. No rash noted.   Cardiovascular: Normal heart rate noted  Respiratory: Normal respiratory effort, no problems with respiration noted  Abdomen: Soft, gravid, appropriate for gestational age. Pain/Pressure: Present     Pelvic:  Cervical exam deferred        Extremities: Normal range of motion.     ental Status: Normal mood and affect. Normal behavior. Normal judgment and thought content.     Assessment   23 y.o. G3P1011 at [redacted]w[redacted]d by  02/15/2019, by Ultrasound presenting for routine prenatal visit  Plan   THIRD Problems (from  11/13/18 to present)    Problem Noted Resolved   Supervision of other normal pregnancy, antepartum 11/13/2018 by Rexene Agent, CNM No   Overview Addendum 12/30/2018  8:44 PM by Dalia Heading, [place] Prenatal Labs  Dating  27 wk [place] Blood type: AB/Positive/-- (05/26 1144)   Genetic Screen Late to care: declines  Antibody:Negative (05/26 1144)  Anatomic [place]  complete Rubella: 11.20 (05/26 1144) Varicella: @VZVIGG @  GTT  28 wk:    Normal  RPR: Non Reactive (05/26 1144)   Rhogam  not needed HBsAg: Negative (05/26 1144)   TDaP vaccine     11/17/2018                  HIV: Non Reactive (05/26 1144)   Flu Shot                           declines     GBS:   Contraception Depo Pap:10/2018 nml  CBB  no   CS/VBAC  cesarean with 1st, arrested at 7cm & NRFHT Scheduled 8/18 w/ Tanesia Butner   Baby Food Bottle   Support Person                 Gestational age appropriate obstetric precautions including but not limited to vaginal bleeding, contractions, leaking of fluid and fetal movement were reviewed in detail with the patient.    Return in about 1 week (around 02/03/2019) for [place].  Malachy Mood, MD, [facility], [facility] 01/27/2019, 9:03 AM

## 2019-01-27 NOTE — Addendum Note (Signed)
Addended by: [place], Colleen Kotlarz B on: 01/27/2019 09:06 AM   Modules accepted: Orders

## 2019-02-02 ENCOUNTER — Ambulatory Visit (INDEPENDENT_AMBULATORY_CARE_PROVIDER_SITE_OTHER): Payer: Medicaid Other | Admitting: Obstetrics and Gynecology

## 2019-02-02 ENCOUNTER — Encounter: Payer: Self-pay | Admitting: Obstetrics and Gynecology

## 2019-02-02 ENCOUNTER — Other Ambulatory Visit: Payer: Self-pay

## 2019-02-02 VITALS — BP 126/68 | HR 115 | Ht 62.0 in | Wt 184.0 lb

## 2019-02-02 DIAGNOSIS — O34219 Maternal care for unspecified type scar from previous cesarean delivery: Secondary | ICD-10-CM

## 2019-02-02 DIAGNOSIS — O99013 Anemia complicating pregnancy, third trimester: Secondary | ICD-10-CM

## 2019-02-02 DIAGNOSIS — Z3A38 38 weeks gestation of pregnancy: Secondary | ICD-10-CM

## 2019-02-02 DIAGNOSIS — Z3480 Encounter for supervision of other normal pregnancy, unspecified trimester: Secondary | ICD-10-CM

## 2019-02-02 NOTE — Progress Notes (Signed)
Obstetric H&P   Chief Complaint: Schedule C-section  Prenatal Care Provider: [facility]  History of Present Illness: 23 y.o. [id number] [redacted]w[redacted]d by 02/15/2019, by Ultrasound presenting for [place] visit today and to schedule repeat cesarean section.  +FM, no LOF, no VB, irregular contractions.  Pregnnacy has otherwise been uncomplicated  Pregravid weight 150 lb (68 kg) Total Weight Gain 31 lb (14.1 kg)  THIRD Problems (from 11/13/18 to present)    Problem Noted Resolved   Supervision of other normal pregnancy, antepartum 11/13/2018 by Rexene Agent, CNM No   Overview Addendum 12/30/2018  8:44 PM by Dalia Heading, [place] Prenatal Labs  Dating  27 wk [place] Blood type: AB/Positive/-- (05/26 1144)   Genetic Screen Late to care: declines  Antibody:Negative (05/26 1144)  Anatomic [place]  complete Rubella: 11.20 (05/26 1144) Varicella: Immune  GTT  28 wk:    Normal  RPR: Non Reactive (05/26 1144)   Rhogam  not needed HBsAg: Negative (05/26 1144)   TDaP vaccine     11/17/2018                  HIV: Non Reactive (05/26 1144)   Flu Shot declines     GBS: neg  Contraception Depo Pap:10/2018 nml  CBB  no   CS/VBAC  cesarean with 1st, arrested at 7cm & NRFHT Scheduled 8/18 w/ [place]   Support Person                 Review of Systems: 10 point review of systems negative unless otherwise noted in HPI  Past Medical History: Past Medical History:  Diagnosis Date  . Medical history non-contributory     Past Surgical History: Past Surgical History:  Procedure Laterality Date  . CESAREAN SECTION N/A 01/08/2016   Procedure: CESAREAN SECTION;  Surgeon: Malachy Mood, MD;  Location: [facility] ORS;  Service: Obstetrics;  Laterality: N/A;  . DENTAL SURGERY      Past Obstetric History: # 1 - Date: 01/08/16, Sex: Female, Weight: 7 lb 11.1 oz (3.49 kg), GA: [redacted]w[redacted]d, Delivery: C-Section, Low Transverse, Apgar1: 7, Apgar5: 9, Living: Living, Birth Comments: None  # 2 -  Date: 01/2017, Sex: None, Weight: None, GA: None, Delivery: None, Apgar1: None, Apgar5: None, Living: None, Birth Comments: None  # 3 - Date: None, Sex: None, Weight: None, GA: None, Delivery: None, Apgar1: None, Apgar5: None, Living: None, Birth Comments: None  Family History: History reviewed. No pertinent family history.  Social History: Social History   Socioeconomic History  . Marital status: Single    Spouse name: Not on file  . Number of children: Not on file  . Years of education: Not on file  . Highest education level: Not on file  Occupational History  . Not on file  Social Needs  . Financial resource strain: Not on file  . Food insecurity    Worry: Not on file    Inability: Not on file  . Transportation needs    Medical: Not on file    Non-medical: Not on file  Tobacco Use  . Smoking status: Former Research scientist (life sciences)  . Smokeless tobacco: Never Used  Substance and Sexual Activity  . Alcohol use: No  . Drug use: No  . Sexual activity: Yes    Partners: Male  Lifestyle  . Physical activity    Days per week: Not on file    Minutes per session: Not on file  . Stress:  Not on file  Relationships  . Social Musicianconnections    Talks on phone: Not on file    Gets together: Not on file    Attends religious service: Not on file    Active member of club or organization: Not on file    Attends meetings of clubs or organizations: Not on file    Relationship status: Not on file  . Intimate partner violence    Fear of current or ex partner: Not on file    Emotionally abused: Not on file    Physically abused: Not on file    Forced sexual activity: Not on file  Other Topics Concern  . Not on file  Social History Narrative  . Not on file    Medications: Prior to Admission medications   Medication Sig Start Date End Date Taking? Authorizing Provider  Prenatal Vit-Fe Fumarate-FA (MULTIVITAMIN-PRENATAL) 27-0.8 MG TABS tablet Take 1 tablet by mouth daily at 12 noon.   Yes [provider]    Allergies: Allergies  Allergen Reactions  . Penicillins Swelling    Did it involve swelling of the face/tongue/throat, SOB, or low BP? Unknown Did it involve sudden or severe rash/hives, skin peeling, or any reaction on the inside of your mouth or nose? Unknown Did you need to seek medical attention at a hospital or doctor's office? Unknown When did it last happen?Childhood allergy If all above answers are "NO", may proceed with cephalosporin use.     Physical Exam: Vitals: Blood pressure 126/68, pulse (!) 115, height 5\' 2"  (1.575 m), weight 184 lb (83.5 kg), last menstrual period 05/03/2018, unknown if currently breastfeeding.  FHT: 130   General: NAD HEENT: normocephalic, anicteric Pulmonary: No increased work of breathing Cardiovascular: RRR, distal pulses 2+ Abdomen: Gravid, non-tender, FHT Le[place]: vtx Genitourinary: deferred Extremities: no edema, erythema, or tenderness Neurologic: Grossly intact Psychiatric: mood appropriate, affect full  Labs: No results found for this or any previous visit (from the past 24 hour(s)).  Assessment: 23 y.o. [id number] 2284w1d by 02/15/2019, by Ultrasound presenting for scheduling of repeat cesaran section  Plan: 1) The patient was counseled regarding risk and benefits to proceeding with Cesarean section to expedite delivery.  Risk of cesarean section were discussed including risk of bleeding and need for potential intraoperative or postoperative blood transfusion with a rate of approximately 5% quoted for all Cesarean sections, risk of injury to adjacent organs including but not limited to bowl and bladder, the need for additional surgical procedures to address such injuries, and the risk of infection.  2) Fetus - +FHT  3) PNL - Blood type AB/Positive/-- (05/26 1144) / Anti-bodyscreen Negative (05/26 1144) / Rubella 11.20 (05/26 1144) / Varicella Immune  / RPR Non Reactive (05/26 1144) / HBsAg Negative (05/26 1144)  / HIV Non Reactive (05/26 1144) / 1-hr OGTT normal / GBS negative  4) Immunization History -  Immunization History  Administered Date(s) Administered  . Hepatitis B, adult 19[birth year]1995  . Tdap 11/17/2018    5) Disposition -   Vena AustriaAndreas Thresia Ramanathan, MD, Merlinda FrederickFACOG [facility], [facility] 02/02/2019, 10:37 AM

## 2019-02-05 ENCOUNTER — Ambulatory Visit
Admission: RE | Admit: 2019-02-05 | Discharge: 2019-02-05 | Disposition: A | Discharge status: Home / Self Care | Payer: Medicaid Other | Source: Ambulatory Visit

## 2019-02-05 ENCOUNTER — Other Ambulatory Visit: Payer: Self-pay

## 2019-02-05 ENCOUNTER — Ambulatory Visit
Admission: RE | Admit: 2019-02-05 | Discharge: 2019-02-05 | Disposition: A | Discharge status: Home / Self Care | Payer: Medicaid Other | Source: Ambulatory Visit | Attending: Obstetrics and Gynecology | Admitting: Obstetrics and Gynecology

## 2019-02-05 DIAGNOSIS — Z20828 Contact with and (suspected) exposure to other viral communicable diseases: Secondary | ICD-10-CM | POA: Diagnosis not present

## 2019-02-05 DIAGNOSIS — Z01812 Encounter for preprocedural laboratory examination: Secondary | ICD-10-CM | POA: Diagnosis present

## 2019-02-05 LAB — SARS CORONAVIRUS 2 (TAT 6-24 HRS): SARS Coronavirus 2: NEGATIVE

## 2019-02-05 NOTE — Pre-Procedure Instructions (Signed)
CALLED NUMBER NOTED IN CHART TO INTERVIEW PATIENT FOR UPCOMING CESAREAN. NO ANSWER X2.

## 2019-02-07 ENCOUNTER — Inpatient Hospital Stay
Admission: RE | Admit: 2019-02-07 | Payer: Medicaid Other | Source: Home / Self Care | Admitting: Obstetrics and Gynecology

## 2019-02-07 ENCOUNTER — Other Ambulatory Visit: Payer: Self-pay

## 2019-02-07 ENCOUNTER — Encounter
Admission: EM | Disposition: A | Discharge status: Home / Self Care | Payer: Self-pay | Source: Home / Self Care | Attending: Obstetrics & Gynecology

## 2019-02-07 ENCOUNTER — Inpatient Hospital Stay
Admission: EM | Admit: 2019-02-07 | Discharge: 2019-02-09 | DRG: 787 | Disposition: A | Discharge status: Home / Self Care | Payer: Medicaid Other | Attending: Obstetrics & Gynecology | Admitting: Obstetrics & Gynecology

## 2019-02-07 ENCOUNTER — Inpatient Hospital Stay: Payer: Medicaid Other | Admitting: Anesthesiology

## 2019-02-07 DIAGNOSIS — O9081 Anemia of the puerperium: Secondary | ICD-10-CM | POA: Diagnosis not present

## 2019-02-07 DIAGNOSIS — Z3A38 38 weeks gestation of pregnancy: Secondary | ICD-10-CM | POA: Diagnosis not present

## 2019-02-07 DIAGNOSIS — D62 Acute posthemorrhagic anemia: Secondary | ICD-10-CM | POA: Diagnosis not present

## 2019-02-07 DIAGNOSIS — Z87891 Personal history of nicotine dependence: Secondary | ICD-10-CM

## 2019-02-07 DIAGNOSIS — Z3480 Encounter for supervision of other normal pregnancy, unspecified trimester: Secondary | ICD-10-CM

## 2019-02-07 DIAGNOSIS — Z880 Allergy status to penicillin: Secondary | ICD-10-CM

## 2019-02-07 DIAGNOSIS — O34219 Maternal care for unspecified type scar from previous cesarean delivery: Secondary | ICD-10-CM | POA: Diagnosis present

## 2019-02-07 DIAGNOSIS — O34211 Maternal care for low transverse scar from previous cesarean delivery: Principal | ICD-10-CM | POA: Diagnosis present

## 2019-02-07 DIAGNOSIS — Z98891 History of uterine scar from previous surgery: Secondary | ICD-10-CM

## 2019-02-07 DIAGNOSIS — D509 Iron deficiency anemia, unspecified: Secondary | ICD-10-CM | POA: Diagnosis present

## 2019-02-07 LAB — CBC
HCT: 33.8 % — ABNORMAL LOW (ref 36.0–46.0)
Hemoglobin: 10.9 g/dL — ABNORMAL LOW (ref 12.0–15.0)
MCH: 25.8 pg — ABNORMAL LOW (ref 26.0–34.0)
MCHC: 32.2 g/dL (ref 30.0–36.0)
MCV: 79.9 fL — ABNORMAL LOW (ref 80.0–100.0)
Platelets: 432 10*3/uL — ABNORMAL HIGH (ref 150–400)
RBC: 4.23 MIL/uL (ref 3.87–5.11)
RDW: 15.3 % (ref 11.5–15.5)
WBC: 16.6 10*3/uL — ABNORMAL HIGH (ref 4.0–10.5)
nRBC: 0 % (ref 0.0–0.2)

## 2019-02-07 LAB — TYPE AND SCREEN
ABO/RH(D): AB POS
Antibody Screen: NEGATIVE

## 2019-02-07 SURGERY — Surgical Case
Anesthesia: *Unknown

## 2019-02-07 SURGERY — Surgical Case
Anesthesia: Epidural

## 2019-02-07 MED ORDER — BUPIVACAINE ON-Q PAIN PUMP (FOR ORDER SET NO CHG)
INJECTION | Status: DC
  Filled 2019-02-07: qty 1

## 2019-02-07 MED ORDER — DIPHENHYDRAMINE HCL 50 MG/ML IJ SOLN: 12.5000 mg | INTRAMUSCULAR | Status: DC | PRN

## 2019-02-07 MED ORDER — SIMETHICONE 80 MG PO CHEW
80.0000 mg | CHEWABLE_TABLET | Freq: Three times a day (TID) | ORAL | Status: DC
  Administered 2019-02-07 – 2019-02-09 (×6): 80 mg via ORAL
  Filled 2019-02-07 (×5): qty 1

## 2019-02-07 MED ORDER — OXYCODONE-ACETAMINOPHEN 5-325 MG PO TABS: 1.0000 | ORAL_TABLET | ORAL | Status: DC | PRN

## 2019-02-07 MED ORDER — TETANUS-DIPHTH-ACELL PERTUSSIS 5-2.5-18.5 LF-MCG/0.5 IM SUSP: 0.5000 mL | Freq: Once | INTRAMUSCULAR | Status: DC

## 2019-02-07 MED ORDER — EPHEDRINE SULFATE 50 MG/ML IJ SOLN
INTRAMUSCULAR | Status: DC | PRN
  Administered 2019-02-07: 50 mg via INTRAMUSCULAR

## 2019-02-07 MED ORDER — PHENYLEPHRINE HCL (PRESSORS) 10 MG/ML IV SOLN
INTRAVENOUS | Status: DC | PRN
  Administered 2019-02-07: 300 ug via INTRAVENOUS
  Administered 2019-02-07: 200 ug via INTRAVENOUS

## 2019-02-07 MED ORDER — BUPIVACAINE IN DEXTROSE 0.75-8.25 % IT SOLN
INTRATHECAL | Status: DC | PRN
  Administered 2019-02-07: 1.5 mL via INTRATHECAL

## 2019-02-07 MED ORDER — LACTATED RINGERS IV SOLN: INTRAVENOUS | Status: DC

## 2019-02-07 MED ORDER — COCONUT OIL OIL: 1.0000 "application " | TOPICAL_OIL | Status: DC | PRN

## 2019-02-07 MED ORDER — CEFAZOLIN SODIUM-DEXTROSE 2-4 GM/100ML-% IV SOLN
2.0000 g | INTRAVENOUS | Status: AC
  Administered 2019-02-07: 2 g via INTRAVENOUS
  Filled 2019-02-07: qty 100

## 2019-02-07 MED ORDER — MENTHOL 3 MG MT LOZG: 1.0000 | LOZENGE | OROMUCOSAL | Status: DC | PRN

## 2019-02-07 MED ORDER — SODIUM CHLORIDE 0.9 % IV SOLN
INTRAVENOUS | Status: DC | PRN
  Administered 2019-02-07: 50 ug/min via INTRAVENOUS

## 2019-02-07 MED ORDER — FENTANYL CITRATE (PF) 100 MCG/2ML IJ SOLN
INTRAMUSCULAR | Status: DC | PRN
  Administered 2019-02-07: 20 ug via INTRATHECAL

## 2019-02-07 MED ORDER — OXYTOCIN 40 UNITS IN NORMAL SALINE INFUSION - SIMPLE MED
INTRAVENOUS | Status: AC
  Filled 2019-02-07: qty 1000

## 2019-02-07 MED ORDER — SIMETHICONE 80 MG PO CHEW
80.0000 mg | CHEWABLE_TABLET | ORAL | Status: DC
  Filled 2019-02-07: qty 1

## 2019-02-07 MED ORDER — BUPIVACAINE HCL (PF) 0.5 % IJ SOLN
INTRAMUSCULAR | Status: DC | PRN
  Administered 2019-02-07: 10 mL

## 2019-02-07 MED ORDER — IBUPROFEN 800 MG PO TABS
800.0000 mg | ORAL_TABLET | Freq: Four times a day (QID) | ORAL | Status: DC
  Administered 2019-02-08 – 2019-02-09 (×4): 800 mg via ORAL
  Filled 2019-02-07 (×4): qty 1

## 2019-02-07 MED ORDER — KETOROLAC TROMETHAMINE 30 MG/ML IJ SOLN: 30.0000 mg | Freq: Four times a day (QID) | INTRAMUSCULAR | Status: DC | PRN

## 2019-02-07 MED ORDER — DIPHENHYDRAMINE HCL 25 MG PO CAPS: 25.0000 mg | ORAL_CAPSULE | ORAL | Status: DC | PRN

## 2019-02-07 MED ORDER — MORPHINE SULFATE (PF) 0.5 MG/ML IJ SOLN
INTRAMUSCULAR | Status: AC
  Filled 2019-02-07: qty 10

## 2019-02-07 MED ORDER — PHENYLEPHRINE HCL (PRESSORS) 10 MG/ML IV SOLN
INTRAVENOUS | Status: AC
  Filled 2019-02-07: qty 1

## 2019-02-07 MED ORDER — KETOROLAC TROMETHAMINE 30 MG/ML IJ SOLN
INTRAMUSCULAR | Status: DC | PRN
  Administered 2019-02-07: 30 mg via INTRAVENOUS

## 2019-02-07 MED ORDER — PRENATAL MULTIVITAMIN CH
1.0000 | ORAL_TABLET | Freq: Every day | ORAL | Status: DC
  Administered 2019-02-07 – 2019-02-08 (×2): 1 via ORAL
  Filled 2019-02-07 (×2): qty 1

## 2019-02-07 MED ORDER — ACETAMINOPHEN 500 MG PO TABS
1000.0000 mg | ORAL_TABLET | Freq: Four times a day (QID) | ORAL | Status: AC
  Administered 2019-02-07 – 2019-02-08 (×4): 1000 mg via ORAL
  Filled 2019-02-07 (×4): qty 2

## 2019-02-07 MED ORDER — KETOROLAC TROMETHAMINE 30 MG/ML IJ SOLN: 30.0000 mg | Freq: Four times a day (QID) | INTRAMUSCULAR | Status: DC

## 2019-02-07 MED ORDER — MORPHINE SULFATE (PF) 2 MG/ML IV SOLN: 1.0000 mg | INTRAVENOUS | Status: DC | PRN

## 2019-02-07 MED ORDER — SOD CITRATE-CITRIC ACID 500-334 MG/5ML PO SOLN
30.0000 mL | ORAL | Status: AC
  Administered 2019-02-07: 07:00:00 30 mL via ORAL

## 2019-02-07 MED ORDER — SODIUM CHLORIDE 0.9 % IV SOLN
INTRAVENOUS | Status: DC | PRN
  Administered 2019-02-07: 40 [IU] via INTRAVENOUS

## 2019-02-07 MED ORDER — ONDANSETRON HCL 4 MG/2ML IJ SOLN
INTRAMUSCULAR | Status: DC | PRN
  Administered 2019-02-07: 4 mg via INTRAVENOUS

## 2019-02-07 MED ORDER — KETOROLAC TROMETHAMINE 30 MG/ML IJ SOLN
INTRAMUSCULAR | Status: AC
  Filled 2019-02-07: qty 1

## 2019-02-07 MED ORDER — SOD CITRATE-CITRIC ACID 500-334 MG/5ML PO SOLN
ORAL | Status: AC
  Administered 2019-02-07: 30 mL via ORAL
  Filled 2019-02-07: qty 30

## 2019-02-07 MED ORDER — OXYTOCIN 40 UNITS IN NORMAL SALINE INFUSION - SIMPLE MED
2.5000 [IU]/h | INTRAVENOUS | Status: DC
  Administered 2019-02-07: 08:00:00 2.5 [IU]/h via INTRAVENOUS

## 2019-02-07 MED ORDER — WITCH HAZEL-GLYCERIN EX PADS: 1.0000 "application " | MEDICATED_PAD | CUTANEOUS | Status: DC | PRN

## 2019-02-07 MED ORDER — SENNOSIDES-DOCUSATE SODIUM 8.6-50 MG PO TABS
2.0000 | ORAL_TABLET | ORAL | Status: DC
  Administered 2019-02-08 – 2019-02-09 (×2): 2 via ORAL
  Filled 2019-02-07 (×2): qty 2

## 2019-02-07 MED ORDER — BUPIVACAINE HCL 0.5 % IJ SOLN
10.0000 mL | Freq: Once | INTRAMUSCULAR | Status: DC
  Filled 2019-02-07: qty 10

## 2019-02-07 MED ORDER — DIBUCAINE (PERIANAL) 1 % EX OINT: 1.0000 "application " | TOPICAL_OINTMENT | CUTANEOUS | Status: DC | PRN

## 2019-02-07 MED ORDER — KETOROLAC TROMETHAMINE 30 MG/ML IJ SOLN
30.0000 mg | Freq: Four times a day (QID) | INTRAMUSCULAR | Status: AC
  Administered 2019-02-07 – 2019-02-08 (×4): 30 mg via INTRAVENOUS
  Filled 2019-02-07 (×4): qty 1

## 2019-02-07 MED ORDER — FENTANYL CITRATE (PF) 100 MCG/2ML IJ SOLN
INTRAMUSCULAR | Status: AC
  Filled 2019-02-07: qty 2

## 2019-02-07 MED ORDER — BUPIVACAINE 0.25 % ON-Q PUMP DUAL CATH 400 ML
400.0000 mL | INJECTION | Status: DC
  Filled 2019-02-07: qty 400

## 2019-02-07 MED ORDER — LACTATED RINGERS IV SOLN
INTRAVENOUS | Status: DC
  Administered 2019-02-07: 06:00:00 via INTRAVENOUS

## 2019-02-07 MED ORDER — ONDANSETRON HCL 4 MG/2ML IJ SOLN
INTRAMUSCULAR | Status: AC
  Filled 2019-02-07: qty 2

## 2019-02-07 MED ORDER — ONDANSETRON HCL 4 MG/2ML IJ SOLN: 4.0000 mg | Freq: Three times a day (TID) | INTRAMUSCULAR | Status: DC | PRN

## 2019-02-07 MED ORDER — NALBUPHINE HCL 10 MG/ML IJ SOLN: 5.0000 mg | INTRAMUSCULAR | Status: DC | PRN

## 2019-02-07 MED ORDER — ACETAMINOPHEN 325 MG PO TABS: 650.0000 mg | ORAL_TABLET | ORAL | Status: DC | PRN

## 2019-02-07 MED ORDER — NALBUPHINE HCL 10 MG/ML IJ SOLN
5.0000 mg | INTRAMUSCULAR | Status: DC | PRN
  Administered 2019-02-07: 09:00:00 5 mg via INTRAVENOUS
  Filled 2019-02-07: qty 1

## 2019-02-07 MED ORDER — SIMETHICONE 80 MG PO CHEW: 80.0000 mg | CHEWABLE_TABLET | ORAL | Status: DC | PRN

## 2019-02-07 MED ORDER — IBUPROFEN 800 MG PO TABS: 800.0000 mg | ORAL_TABLET | Freq: Four times a day (QID) | ORAL | Status: DC

## 2019-02-07 MED ORDER — NALOXONE HCL 0.4 MG/ML IJ SOLN: 0.4000 mg | INTRAMUSCULAR | Status: DC | PRN

## 2019-02-07 MED ORDER — ZOLPIDEM TARTRATE 5 MG PO TABS: 5.0000 mg | ORAL_TABLET | Freq: Every evening | ORAL | Status: DC | PRN

## 2019-02-07 MED ORDER — OXYTOCIN 40 UNITS IN NORMAL SALINE INFUSION - SIMPLE MED
INTRAVENOUS | Status: AC
  Administered 2019-02-07: 2.5 [IU]/h via INTRAVENOUS
  Filled 2019-02-07: qty 1000

## 2019-02-07 MED ORDER — BUPIVACAINE HCL (PF) 0.5 % IJ SOLN
INTRAMUSCULAR | Status: AC
  Filled 2019-02-07: qty 30

## 2019-02-07 MED ORDER — MORPHINE SULFATE (PF) 0.5 MG/ML IJ SOLN
INTRAMUSCULAR | Status: DC | PRN
  Administered 2019-02-07: .1 mg via INTRATHECAL

## 2019-02-07 MED ORDER — DIPHENHYDRAMINE HCL 25 MG PO CAPS: 25.0000 mg | ORAL_CAPSULE | Freq: Four times a day (QID) | ORAL | Status: DC | PRN

## 2019-02-07 MED ORDER — SODIUM CHLORIDE 0.9% FLUSH: 3.0000 mL | INTRAVENOUS | Status: DC | PRN

## 2019-02-07 SURGICAL SUPPLY — 32 items
BAG COUNTER SPONGE EZ (MISCELLANEOUS) ×2 IMPLANT
CANISTER SUCT 3000ML PPV (MISCELLANEOUS) ×3 IMPLANT
CATH KIT ON-Q SILVERSOAK 5IN (CATHETERS) ×6 IMPLANT
CHLORAPREP W/TINT 26 (MISCELLANEOUS) ×6 IMPLANT
CLOSURE WOUND 1/2 X4 (GAUZE/BANDAGES/DRESSINGS) ×1
COUNTER SPONGE BAG EZ (MISCELLANEOUS) ×1
DERMABOND ADVANCED (GAUZE/BANDAGES/DRESSINGS) ×2
DERMABOND ADVANCED .7 DNX12 (GAUZE/BANDAGES/DRESSINGS) ×1 IMPLANT
DRSG OPSITE POSTOP 4X10 (GAUZE/BANDAGES/DRESSINGS) ×3 IMPLANT
DRSG TELFA 3X8 NADH (GAUZE/BANDAGES/DRESSINGS) ×3 IMPLANT
ELECT CAUTERY BLADE 6.4 (BLADE) ×3 IMPLANT
ELECT REM PT RETURN 9FT ADLT (ELECTROSURGICAL) ×3
ELECTRODE REM PT RTRN 9FT ADLT (ELECTROSURGICAL) ×1 IMPLANT
GAUZE SPONGE 4X4 12PLY STRL (GAUZE/BANDAGES/DRESSINGS) ×3 IMPLANT
GLOVE BIO SURGEON STRL SZ7 (GLOVE) ×3 IMPLANT
GLOVE INDICATOR 7.5 STRL GRN (GLOVE) ×3 IMPLANT
GOWN STRL REUS W/ TWL LRG LVL3 (GOWN DISPOSABLE) ×3 IMPLANT
GOWN STRL REUS W/TWL LRG LVL3 (GOWN DISPOSABLE) ×6
HANDLE YANKAUER SUCT BULB TIP (MISCELLANEOUS) ×3 IMPLANT
NS IRRIG 1000ML POUR BTL (IV SOLUTION) ×3 IMPLANT
PACK C SECTION AR (MISCELLANEOUS) ×3 IMPLANT
PAD OB MATERNITY 4.3X12.25 (PERSONAL CARE ITEMS) ×3 IMPLANT
PAD PREP 24X41 OB/GYN DISP (PERSONAL CARE ITEMS) ×3 IMPLANT
PENCIL SMOKE ULTRAEVAC 22 CON (MISCELLANEOUS) ×3 IMPLANT
STRIP CLOSURE SKIN 1/2X4 (GAUZE/BANDAGES/DRESSINGS) ×2 IMPLANT
SUT MNCRL AB 4-0 PS2 18 (SUTURE) ×3 IMPLANT
SUT PDS AB 1 TP1 96 (SUTURE) ×6 IMPLANT
SUT VIC AB 0 CTX 36 (SUTURE) ×4
SUT VIC AB 0 CTX36XBRD ANBCTRL (SUTURE) ×2 IMPLANT
SUT VIC AB 2-0 CT1 36 (SUTURE) ×3 IMPLANT
TUBING CONNECTING 10 (TUBING) ×2 IMPLANT
TUBING CONNECTING 10' (TUBING) ×1

## 2019-02-07 NOTE — Transfer of Care (Signed)
Immediate Anesthesia Transfer of Care Note  Patient: Shelby Cooper  Procedure(s) Performed: CESAREAN SECTION (N/A )  Patient Location: Mother/Baby  Anesthesia Type:Spinal  Level of Consciousness: awake, alert  and oriented  Airway & Oxygen Therapy: Patient Spontanous Breathing  Post-op Assessment: Report given to RN and Post -op Vital signs reviewed and stable  Post vital signs: Reviewed and stable  Last Vitals:  Vitals Value Taken Time  BP    Temp    Pulse    Resp    SpO2      Last Pain:  Vitals:   02/07/19 0717  TempSrc:   PainSc: 0-No pain         Complications: No apparent anesthesia complications

## 2019-02-07 NOTE — Anesthesia Preprocedure Evaluation (Addendum)
Anesthesia Evaluation  Patient identified by MRN, date of birth, ID band Patient awake    Reviewed: Allergy & Precautions, H&P , NPO status , Patient's Chart, lab work & pertinent test results, reviewed documented beta blocker date and time   Airway Mallampati: III  TM Distance: >3 FB Neck ROM: full    Dental no notable dental hx. (+) Poor Dentition   Pulmonary neg pulmonary ROS, neg shortness of breath, Current Smoker, former smoker,    Pulmonary exam normal breath sounds clear to auscultation       Cardiovascular Exercise Tolerance: Good (-) hypertension(-) angina(-) Past MI and (-) DOE negative cardio ROS   Rhythm:regular Rate:Normal     Neuro/Psych negative neurological ROS  negative psych ROS   GI/Hepatic negative GI ROS, Neg liver ROS,   Endo/Other  negative endocrine ROSdiabetes  Renal/GU   negative genitourinary   Musculoskeletal   Abdominal   Peds  Hematology  (+) Blood dyscrasia, anemia ,   Anesthesia Other Findings Past Medical History:   Medical history non-contributory                            Past Surgical History:   DENTAL SURGERY                                               BMI    Body Mass Index   32.13 kg/m 2      Reproductive/Obstetrics (+) Pregnancy                            Anesthesia Physical  Anesthesia Plan  ASA: II  Anesthesia Plan: Epidural   Post-op Pain Management:  Regional for Post-op pain   Induction:   PONV Risk Score and Plan:   Airway Management Planned:   Additional Equipment:   Intra-op Plan:   Post-operative Plan:   Informed Consent: I have reviewed the patients History and Physical, chart, labs and discussed the procedure including the risks, benefits and alternatives for the proposed anesthesia with the patient or authorized representative who has indicated his/her understanding and acceptance.       Plan Discussed  with: Anesthesiologist  Anesthesia Plan Comments:        Anesthesia Quick Evaluation

## 2019-02-07 NOTE — Op Note (Signed)
Cesarean Section Procedure Note Indications: prior cesarean section and term intrauterine pregnancy  Pre-operative Diagnosis: Intrauterine pregnancy [redacted]w[redacted]d ;  prior cesarean section and term intrauterine pregnancy Post-operative Diagnosis: same, delivered. Procedure: Low Transverse Cesarean Section Surgeon: Barnett Applebaum, MD, FACOG Assistant(s): A Ore Anesthesia: Spinal anesthesia Estimated Blood [id number]  Complications: None; patient tolerated the procedure well. Disposition: PACU - hemodynamically stable. Condition: stable  Findings: A female infant in the cephalic presentation. Amniotic fluid - Clear  Birth weight 8-0 lbs.  Apgars of 8 and 9.  Intact placenta with a three-vessel cord. Grossly normal uterus, tubes and ovaries bilaterally. No intraabdominal adhesions were noted.  Procedure Details   The patient was taken to Operating Room, identified as the correct patient and the procedure verified as C-Section Delivery. A Time Out was held and the above information confirmed. After induction of anesthesia, the patient was draped and prepped in the usual sterile manner. A Pfannenstiel incision was made and carried down through the subcutaneous tissue to the fascia. Fascial incision was made and extended transversely with the Mayo scissors. The fascia was separated from the underlying rectus tissue superiorly and inferiorly. The peritoneum was identified and entered bluntly. Peritoneal incision was extended longitudinally. The utero-vesical peritoneal reflection was incised transversely and a bladder flap was created digitally.  A low transverse hysterotomy was made. The fetus was delivered atraumatically. The umbilical cord was clamped x2 and cut and the infant was handed to the awaiting pediatricians. The placenta was removed intact and appeared normal with a 3-vessel cord.  The uterus was exteriorized and cleared of all clot and debris. The hysterotomy was closed with running sutures of 0  Vicryl suture. A second imbricating layer was placed with the same suture. Excellent hemostasis was observed. The uterus was returned to the abdomen. The pelvis was irrigated and again, excellent hemostasis was noted.  The On Q Pain pump System was then placed.  Trocars were placed through the abdominal wall into the subfascial space and these were used to thread the silver soaker cathaters into place.The rectus fascia was then reapproximated with running sutures of [place], with careful placement not to incorporate the cathaters. Subcutaneous tissues are then irrigated with saline and hemostasis assured.  Skin is then closed with 4-0 vicryl suture in a subcuticular fashion followed by skin adhesive. The cathaters are flushed each with 5 mL of Bupivicaine and stabilized into place with dressing. Instrument, sponge, and needle counts were correct prior to the abdominal closure and at the conclusion of the case.  The patient tolerated the procedure well and was transferred to the recovery room in stable condition.   Barnett Applebaum, MD, [facility], [facility] 02/07/2019  7:49 AM

## 2019-02-07 NOTE — Anesthesia Postprocedure Evaluation (Signed)
Anesthesia Post Note  Patient: Shelby Cooper  Procedure(s) Performed: CESAREAN SECTION (N/A )  Patient location during evaluation: Mother Baby Anesthesia Type: Spinal Level of consciousness: awake and alert Pain management: pain level controlled Vital Signs Assessment: post-procedure vital signs reviewed and stable Respiratory status: spontaneous breathing, nonlabored ventilation and respiratory function stable Cardiovascular status: stable Postop Assessment: no headache, no backache and spinal receding Anesthetic complications: no     Last Vitals:  Vitals:   02/07/19 1100 02/07/19 1200  BP: 98/65 94/80  Pulse: (!) 123 (!) 135  Resp: 20 (!) 24  Temp: 37 C 37.1 C  SpO2: 99% 99%    Last Pain:  Vitals:   02/07/19 1200  TempSrc: Oral  PainSc:                  Alphonsus Sias

## 2019-02-07 NOTE — Discharge Summary (Signed)
OB Discharge Summary     Patient Name: Shelby BlaseKatana Brierley DOB: 01/21/1996 MRN: [id number]  Date of admission: 02/07/2019 Delivering MD: Letitia Libraobert Paul Harris, MD  Date of Delivery: 02/07/2019  Date of discharge: 02/09/2019  Admitting diagnosis: 38 wks preg contractions Intrauterine pregnancy: 6648w6d    Prior CS Secondary diagnosis: None     Discharge diagnosis: Term Pregnancy Delivered, Reasons for cesarean section  Elective repeat                         Hospital course:  Onset of Labor With planned C/S  23 y.o. yo G3P1011 at 6448w6d was admitted in Active Labor on 02/07/2019. Patient had a labor course significant for none- desires repeat and not to VBAC. Membrane Rupture Time/Date in OR: 7:08 AM ,02/07/2019   The patient went for cesarean section due to Elective Repeat, and delivered a Viable infant,02/07/2019  Details of operation can be found in separate operative note. Patient had an uncomplicated postpartum course.  She is ambulating,tolerating a regular diet, passing flatus, and urinating well.  Patient is discharged home in stable condition 02/09/19.                                                                 Post partum procedures:none  Complications: None  Physical exam on 02/09/2019: Vitals:   02/08/19 1134 02/08/19 1530 02/08/19 2309 02/09/19 0802  BP: 101/81 106/82 118/79 109/70  Pulse: (!) 111 100 92 97  Resp: 20 20 18 20   Temp: 98.4 F (36.9 C) 99.2 F (37.3 C) 98.5 F (36.9 C) 98.6 F (37 C)  TempSrc:   Oral Oral  SpO2: 99% 98% 100% 100%  Weight:      Height:       General: alert, cooperative and no distress. Reports passing flatus and having BMs. Voiding withiut difficulty. Pain managed with Tylenol and Motrin. Denies lightheadedness when OOB. Heart: mild tachycardia, no murmur Lungs: CTAB, normal respiratory effort Lochia: appropriate Abdomen: soft, NT Incision: Healing well with no significant drainage; ON Q dressing changed DVT Evaluation: No evidence of DVT  seen on physical exam.  Labs: Lab Results  Component Value Date   WBC 16.6 (H) 02/08/2019   HGB 8.6 (L) 02/08/2019   HCT 26.9 (L) 02/08/2019   MCV 80.8 02/08/2019   PLT 338 02/08/2019   CMP Latest Ref Rng & Units 10/26/2016  Glucose 65 - 99 mg/dL 213(Y124(H)  BUN 6 - 20 mg/dL 86(V26(H)  Creatinine 7.840.44 - 1.00 mg/dL 6.960.78  Sodium 295135 - 284145 mmol/L 140  Potassium 3.5 - 5.1 mmol/L 3.2(L)  Chloride 101 - 111 mmol/L 102  CO2 22 - 32 mmol/L 21(L)  Calcium 8.9 - 10.3 mg/dL 10.8(H)  Total Protein 6.5 - 8.1 g/dL -  Total Bilirubin 0.3 - 1.2 mg/dL -  Alkaline Phos 38 - 132126 U/L -  AST 15 - 41 U/L -  ALT 14 - 54 U/L -    Discharge instruction: per After Visit Summary.  Medications:  Allergies as of 02/09/2019      Reactions   Penicillins Swelling   Did it involve swelling of the face/tongue/throat, SOB, or low BP? Unknown Did it involve sudden or severe rash/hives, skin peeling, or any reaction on the inside of  your mouth or nose? Unknown Did you need to seek medical attention at a hospital or doctor's office? Unknown When did it last happen?Childhood allergy If all above answers are "NO", may proceed with cephalosporin use.      Medication List    TAKE these medications   acetaminophen 325 MG tablet Commonly known as: TYLENOL Take 2 tablets (650 mg total) by mouth every 4 (four) hours as needed for mild pain (temperature > 101.5.).   ferrous sulfate 325 (65 FE) MG tablet Take 1 tablet (325 mg total) by mouth daily with breakfast. Start taking on: February 10, 2019   ibuprofen 800 MG tablet Commonly known as: ADVIL Take 1 tablet (800 mg total) by mouth every 8 (eight) hours as needed for mild pain or moderate pain.   multivitamin-prenatal 27-0.8 MG Tabs tablet Take 1 tablet by mouth daily at 12 noon.   oxyCODONE 5 MG immediate release tablet Commonly known as: Roxicodone Take 1 tablet (5 mg total) by mouth every 6 (six) hours as needed for up to 5 days for severe pain or  breakthrough pain.       Diet: routine diet  Activity: Advance as tolerated. Pelvic rest for 6 weeks.   Outpatient follow up: Follow-up Information    Gae Dry, MD. Schedule an appointment as soon as possible for a visit in 1 week(s).   Specialty: Obstetrics and Gynecology Why: Post Op, or keep scheduled w Dr Meryl Dare information: [address] [place] [address] [phone]             Postpartum contraception: Depo Provera Rhogam Given postpartum: no Rubella vaccine given postpartum: no Varicella vaccine given postpartum: no TDaP given antepartum or postpartum: Yes  Newborn Data: Live born female [place] Birth Weight: 8 lb 1.8 oz (3680 g) APGAR: 8, 9  Newborn Delivery   Birth date/time: 02/07/2019 07:07:00 Delivery type: C-Section, Low Transverse Trial of labor: No C-section categorization: Repeat       Baby Feeding: Bottle  Disposition:home with mother  SIGNED: Dalia Heading, CNM 02/09/2019 11:26 AM

## 2019-02-07 NOTE — H&P (Signed)
Obstetrics Admission History & Physical   Contractions   HPI:  23 y.o. [id number] @ 2776w6d (02/15/2019, by Ultrasound). Admitted on 02/07/2019:   Patient Active Problem List   Diagnosis Date Noted  . Anemia of mother in pregnancy, antepartum, third trimester 12/30/2018  . Previous cesarean section complicating pregnancy 12/30/2018  . Supervision of other normal pregnancy, antepartum 11/13/2018     Presents for painful ctxs since 1200 ([place]), no VB or ROM but pain is increasing.  Prior CS for arrest of dilation at 7 cm with some fetal intolerence to labor then..   [facility]. Pregnancy complicated by none.  ROS: A review of systems was performed and negative, except as stated in the above HPI. PMHx:  Past Medical History:  Diagnosis Date  . Medical history non-contributory    PSHx:  Past Surgical History:  Procedure Laterality Date  . CESAREAN SECTION N/A 01/08/2016   Procedure: CESAREAN SECTION;  Surgeon: Vena AustriaAndreas Staebler, MD;  Location: [facility] ORS;  Service: Obstetrics;  Laterality: N/A;  . DENTAL SURGERY     Medications:  Medications Prior to Admission  Medication Sig Dispense Refill Last Dose  . Prenatal Vit-Fe Fumarate-FA (MULTIVITAMIN-PRENATAL) 27-0.8 MG TABS tablet Take 1 tablet by mouth daily at 12 noon.   02/06/2019 at Unknown time   Allergies: is allergic to penicillins. OBHx:  OB History  Gravida Para Term Preterm AB Living  3 1 1   1 1   SAB TAB Ectopic Multiple Live Births  1     0 1    # Outcome Date GA Lbr Len/2nd Weight Sex Delivery Anes PTL Lv  3 Current           2 SAB 01/2017          1 Term 01/08/16 4540w4d  3490 g F CS-LTranv Spinal  LIV   AVW:UJWJXBJY/NWGNFAOZHYQMFHx:Negative/unremarkable except as detailed in HPI.Marland Kitchen.  No family history of birth defects. Soc Hx: Alcohol: none and Recreational drug use: none  Objective:   Vitals:   02/07/19 0528  BP: 116/82  Pulse: (!) 124  Resp: 20  Temp: 98.3 F (36.8 C)   Constitutional: Well nourished, well developed  female in no acute distress.  HEENT: normal Skin: Warm and dry.  Cardiovascular:Regular rate and rhythm.   Extremity: trace to 1+ bilateral pedal edema Respiratory: Clear to auscultation bilateral. Normal respiratory effort Abdomen: gravid, ND, FHT present, moderate tenderness on exam Back: no CVAT Neuro: DTRs 2+, Cranial nerves grossly intact Psych: Alert and Oriented x3. No memory deficits. Normal mood and affect.  MS: normal gait, normal bilateral lower extremity ROM/strength/stability.  Pelvic exam: is not limited by body habitus EGBUS: within normal limits Vagina: within normal limits and with normal mucosa Cervix: CERVIX: 6-7 cm dilated, 80 effaced, -2 station Uterus: Spontaneous uterine activity  Adnexa: not evaluated  EFM:FHR: 150 bpm, variability: moderate,  accelerations:  Present,  decelerations:  Absent Toco: Frequency: Every 5 minutes   Perinatal info:  Blood type: AB positive Rubella- Immune Varicella -Immune TDaP Given during third trimester of this pregnancy RPR NR / HIV Neg/ HBsAg Neg   Assessment & Plan:   23 y.o. [id number] @ 6576w6d, Admitted on 02/07/2019:In active labor with prior h/o cesarean; pt is counseled as to options now of VBAC vs CS and she prefers CS    Fetal Wellbeing Reassuring   The risks of cesarean section discussed with the patient included but were not limited to: bleeding which may require transfusion or reoperation; infection which  may require antibiotics; injury to bowel, bladder, ureters or other surrounding organs; injury to the fetus; need for additional procedures including hysterectomy in the event of a life-threatening hemorrhage; placental abnormalities wth subsequent pregnancies, incisional problems, thromboembolic phenomenon and other postoperative/anesthesia complications. The patient concurred with the proposed plan, giving informed written consent for the procedure.    Barnett Applebaum, MD, [facility], [place]  [facility] 02/07/2019  5:49 AM

## 2019-02-07 NOTE — OB Triage Note (Signed)
Pt is a 23y/o G3P1011 at [redacted]w[redacted]d with c/o ctx beginning at midnight rating them 10/10. Pt denies vaginal bleeding and LOF. Pt states positive fetal movement. Monitors applied and assessing. Initial FHT 140

## 2019-02-07 NOTE — Anesthesia Post-op Follow-up Note (Signed)
Anesthesia QCDR form completed.        

## 2019-02-07 NOTE — Anesthesia Procedure Notes (Signed)
Spinal  Patient location during procedure: OR Start time: 02/07/2019 6:45 AM End time: 02/07/2019 6:53 AM Staffing Resident/CRNA: Clinton Sawyer, CRNA Performed: resident/CRNA  Preanesthetic Checklist Completed: patient identified, site marked, surgical consent, pre-op evaluation, timeout performed, IV checked, risks and benefits discussed and monitors and equipment checked Spinal Block Patient position: sitting Prep: ChloraPrep Patient monitoring: heart rate, continuous pulse ox and blood pressure Approach: midline Location: L3-4 Injection technique: single-shot Needle Needle type: [place]  Needle gauge: 24 G Needle length: 9 cm Assessment Sensory level: T6 Additional Notes Negative heme, negative paresthesia, clear CSF.  2.5 ml 0.75% marcaine and 50mcg fent and 0.1mg  duramorph given

## 2019-02-08 LAB — CBC
HCT: 26.9 % — ABNORMAL LOW (ref 36.0–46.0)
Hemoglobin: 8.6 g/dL — ABNORMAL LOW (ref 12.0–15.0)
MCH: 25.8 pg — ABNORMAL LOW (ref 26.0–34.0)
MCHC: 32 g/dL (ref 30.0–36.0)
MCV: 80.8 fL (ref 80.0–100.0)
Platelets: 338 10*3/uL (ref 150–400)
RBC: 3.33 MIL/uL — ABNORMAL LOW (ref 3.87–5.11)
RDW: 15.3 % (ref 11.5–15.5)
WBC: 16.6 10*3/uL — ABNORMAL HIGH (ref 4.0–10.5)
nRBC: 0 % (ref 0.0–0.2)

## 2019-02-08 MED ORDER — FERROUS SULFATE 325 (65 FE) MG PO TABS
325.0000 mg | ORAL_TABLET | Freq: Every day | ORAL | Status: DC
  Administered 2019-02-08 – 2019-02-09 (×2): 325 mg via ORAL
  Filled 2019-02-08 (×2): qty 1

## 2019-02-08 NOTE — Progress Notes (Signed)
Obstetric Postpartum/PostOperative Daily Progress Note Subjective:  23 y.o. [id number] post-operative day # 1 status post repeat cesarean delivery.  She is ambulating, is tolerating po, is voiding spontaneously.  Her pain is well controlled on PO pain medications. Her lochia is less than menses. She is doing well and has no complaints.   Medications SCHEDULED MEDICATIONS  . ibuprofen  800 mg Oral Q6H  . prenatal multivitamin  1 tablet Oral Q1200  . senna-docusate  2 tablet Oral Q24H  . simethicone  80 mg Oral TID PC  . simethicone  80 mg Oral Q24H  . Tdap  0.5 mL Intramuscular Once    MEDICATION INFUSIONS  . bupivacaine 0.25 % ON-Q pump DUAL CATH 400 mL    . bupivacaine ON-Q pain pump    . lactated ringers      PRN MEDICATIONS  acetaminophen, coconut oil, witch hazel-glycerin **AND** dibucaine, diphenhydrAMINE **OR** diphenhydrAMINE, diphenhydrAMINE, menthol-cetylpyridinium, morphine injection, nalbuphine **OR** nalbuphine, naloxone **AND** sodium chloride flush, ondansetron (ZOFRAN) IV, oxyCODONE-acetaminophen, simethicone, zolpidem    Objective:   Vitals:   02/08/19 0100 02/08/19 0300 02/08/19 0500 02/08/19 0830  BP:    104/73  Pulse: 89 (!) 101 90 94  Resp:    18  Temp:    98.6 F (37 C)  TempSrc:    Oral  SpO2: 100% 98% 99% 100%  Weight:      Height:        Current Vital Signs 24h Vital Sign Ranges  T 98.6 F (37 C) Temp  Avg: 98.6 F (37 C)  Min: 98 F (36.7 C)  Max: 98.8 F (37.1 C)  BP 104/73 BP  Min: 91/48  Max: 108/69  HR 94 Pulse  Avg: 104.4  Min: 88  Max: 135  RR 18 Resp  Avg: 19.8  Min: 18  Max: 24  SaO2 100 % (room air) SpO2  Avg: 99.3 %  Min: 96 %  Max: 100 %       24 Hour I/O Current Shift I/O  Time Ins Outs 08/16 0701 - 08/17 0700 In: 225.8 [I.V.:225.8] Out: 2035 [Urine:1620] No intake/output data recorded.  General: NAD Pulmonary: no increased work of breathing Abdomen: non-distended, non-tender, fundus firm at level of umbilicus Inc:  Clean/dry/intact Extremities: no edema, no erythema, no tenderness  Labs:  Recent Labs  Lab 02/07/19 0606 02/08/19 0422  WBC 16.6* 16.6*  HGB 10.9* 8.6*  HCT 33.8* 26.9*  PLT 432* 338     Assessment:   23 y.o. [id number] postoperative day # 1 status post repeat cesarean section  Plan:  1) Acute blood loss anemia - Hemoglobin decreased from 10.9 to 8.6, asymptomatic - po ferrous sulfate  2) AB POS / Rubella 11.20 (05/26 1144)/ Varicella Immune  3) TDAP status given antepartum  4) Feeding: formula/Contraception = Depo-Provera  5) Disposition: continue routine postpartum care   Rod Can, CNM 02/08/2019 10:47 AM

## 2019-02-09 DIAGNOSIS — D509 Iron deficiency anemia, unspecified: Secondary | ICD-10-CM | POA: Diagnosis present

## 2019-02-09 LAB — RPR: RPR Ser Ql: NONREACTIVE

## 2019-02-09 MED ORDER — ACETAMINOPHEN 325 MG PO TABS: 650.0000 mg | ORAL_TABLET | ORAL | 0 refills | Status: AC | PRN

## 2019-02-09 MED ORDER — OXYCODONE HCL 5 MG PO TABS: 5.0000 mg | ORAL_TABLET | Freq: Four times a day (QID) | ORAL | 0 refills | Status: AC | PRN

## 2019-02-09 MED ORDER — FERROUS SULFATE 325 (65 FE) MG PO TABS: 325.0000 mg | ORAL_TABLET | Freq: Every day | ORAL | 1 refills | Status: AC

## 2019-02-09 MED ORDER — MEDROXYPROGESTERONE ACETATE 150 MG/ML IM SUSP
150.0000 mg | Freq: Once | INTRAMUSCULAR | Status: AC
  Administered 2019-02-09: 150 mg via INTRAMUSCULAR
  Filled 2019-02-09: qty 1

## 2019-02-09 MED ORDER — IBUPROFEN 800 MG PO TABS: 800.0000 mg | ORAL_TABLET | Freq: Three times a day (TID) | ORAL | 0 refills | Status: AC | PRN

## 2019-02-09 NOTE — Progress Notes (Signed)
Patient discharged home with infant. Discharge instructions and prescriptions given and reviewed with patient. Patient verbalized understanding. Escorted out by staff.  

## 2019-02-09 NOTE — Discharge Instructions (Signed)
Discharge Instructions:   Follow-up Appointment:   If there are any new medications, they have been ordered and will be available for pickup at the listed pharmacy on your way home from the hospital.   Call office if you have any of the following: headache, visual changes, fever >101.0 F, chills, shortness of breath, breast concerns, excessive vaginal bleeding, incision drainage or problems, leg pain or redness, depression or any other concerns. If you have vaginal discharge with an odor, let your doctor know.   It is normal to bleed for up to 6 weeks. You should not soak through more than 1 pad in 1 hour. If you have a blood clot larger than your fist with continued bleeding, call your doctor.   Activity: Do not lift > 10 lbs for 6 weeks (do not lift anything heavier than your baby). No intercourse, tampons, swimming pools, hot tubs, baths (only showers) for 6 weeks.  No driving for 1-2 weeks. Continue prenatal vitamin, especially if breastfeeding. Increase calories and fluids (water) while breastfeeding.   Your milk will come in, in the next couple of days (right now it is colostrum). You may have a slight fever when your milk comes in, but it should go away on its own.  If it does not, and rises above 101 F please call the doctor. You will also feel achy and your breasts will be firm. They will also start to leak. If you are breastfeeding, continue as you have been and you can pump/express milk for comfort.   If you have too much milk, your breasts can become engorged, which could lead to mastitis. This is an infection of the milk ducts. It can be very painful and you will need to notify your doctor to obtain a prescription for antibiotics. You can also treat it with a shower or hot/cold compress.   For concerns about your baby, please call your pediatrician.  For breastfeeding concerns, the lactation consultant can be reached at [phone].   Postpartum blues (feelings of happy one  minute and sad another minute) are normal for the first few weeks but if it gets worse let your doctor know.   Congratulations! We enjoyed caring for you and your new bundle of joy!     Cesarean Delivery, Care After Refer to this sheet in the next few weeks. These instructions provide you with information on caring for yourself after your procedure. Your health care provider may also give you specific instructions. Your treatment has been planned according to current medical practices, but problems sometimes occur. Call your health care provider if you have any problems or questions after you go home. HOME CARE INSTRUCTIONS  If you have an On-Q pump, remove it on the 5th day after your surgery, by removing the dressing/bandage and pulling the pump out. Cover the site where the pump strings came out with a band-aid, as needed.  Only take over-the-counter or prescription medications as directed by your health care provider.  Do not drink alcohol, especially if you are breastfeeding or taking medication to relieve pain.  Do not  smoke tobacco.  Continue to use good perineal care. Good perineal care includes:  Wiping your perineum from front to back.  Keeping your perineum clean.  Check your surgical cut (incision) daily for increased redness, drainage, swelling, or separation of skin.  Shower and clean your incision gently with soap and water every day, by letting warm and soapy water run over the incision, and then pat it  your health care provider says it is okay, leave the incision uncovered. Use a bandage (dressing) if the incision is draining fluid or appears irritated. If the adhesive strips across the incision do not fall off within 7 days, carefully peel them off, after a shower.  Hug a pillow when coughing or sneezing until your incision is healed. This helps to relieve pain.  Do not use tampons, douches or have sexual intercourse, until your health care provider says it is  okay.  Wear a well-fitting bra that provides breast support.  Limit wearing support panties or control-top hose.  Drink enough fluids to keep your urine clear or pale yellow.  Eat high-fiber foods such as whole grain cereals and breads, brown rice, beans, and fresh fruits and vegetables every day. These foods may help prevent or relieve constipation.  Resume activities such as climbing stairs, driving, lifting, exercising, or traveling as directed by your health care provider.  Try to have someone help you with your household activities and your newborn for at least a few days after you leave the hospital.  Rest as much as possible. Try to rest or take a nap when your newborn is sleeping.  Increase your activities gradually.  Do not lift more than 15lbs until directed by a provider.  Keep all of your scheduled postpartum appointments. It is very important to keep your scheduled follow-up appointments. At these appointments, your health care provider will be checking to make sure that you are healing physically and emotionally. SEEK MEDICAL CARE IF:   You are passing large clots from your vagina. Save any clots to show your health care provider.  You have a foul smelling discharge from your vagina.  You have trouble urinating.  You are urinating frequently.  You have pain when you urinate.  You have a change in your bowel movements.  You have increasing redness, pain, or swelling near your incision.  You have pus draining from your incision.  Your incision is separating.  You have painful, hard, or reddened breasts.  You have a severe headache.  You have blurred vision or see spots.  You feel sad or depressed.  You have thoughts of hurting yourself or your newborn.  You have questions about your care, the care of your newborn, or medications.  You are dizzy or light-headed.  You have a rash.  You have pain, redness, or swelling at the site of the removed  intravenous access (IV) tube.  You have nausea or vomiting.  You stopped breastfeeding and have not had a menstrual period within 12 weeks of stopping.  You are not breastfeeding and have not had a menstrual period within 12 weeks of delivery.  You have a fever. SEEK IMMEDIATE MEDICAL CARE IF:  You have persistent pain.  You have chest pain.  You have shortness of breath.  You faint.  You have leg pain.  You have stomach pain.  Your vaginal bleeding saturates 2 or more sanitary pads in 1 hour. MAKE SURE YOU:   Understand these instructions.  Will watch your condition.  Will get help right away if you are not doing well or get worse. Document Released: 03/02/2002 Document Revised: 10/25/2013 Document Reviewed: 02/05/2012 ExitCare Patient Information 2015 [employer]. This information is not intended to replace advice given to you by your health care provider. Make sure you discuss any questions you have with your health care provider.   

## 2019-02-10 ENCOUNTER — Encounter: Payer: Medicaid Other | Admitting: Obstetrics & Gynecology

## 2019-03-30 IMAGING — US US OB COMP LESS 14 WK
1 series · 14 of 28 positions shown · non-contrast
Comparison: 08/10/2015

CLINICAL DATA: Nausea and vomiting with lower abdominal pain.
Unknown gestational age. Beta HCG of [DATE].

EXAM:
OBSTETRIC <14 WK ULTRASOUND
TECHNIQUE: Transabdominal ultrasound was performed for evaluation of the
gestation as well as the maternal uterus and adnexal regions.

[Series 1: us ob comp less 14 wk · 0.20mm/px · 14 of 67 slices shown]
[im 3/67]
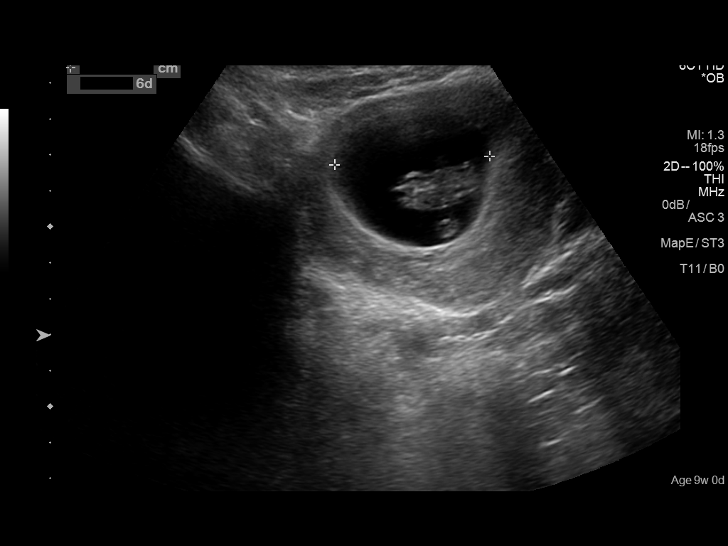
[im 8/67]
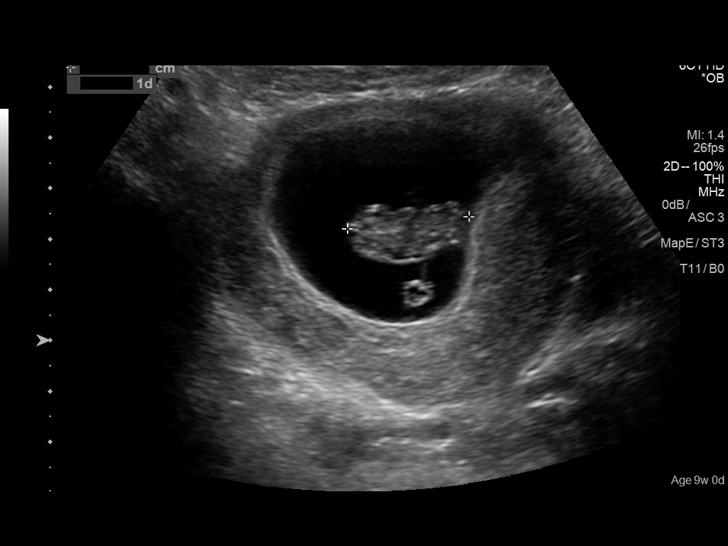
[im 13/67]
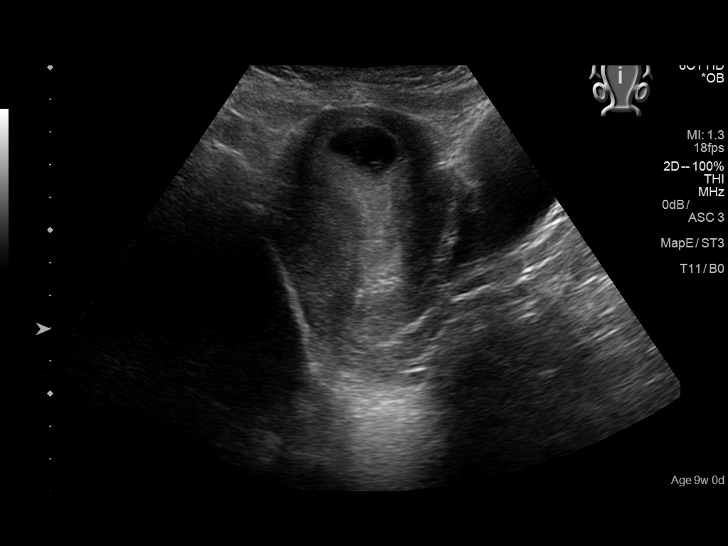
[im 18/67]
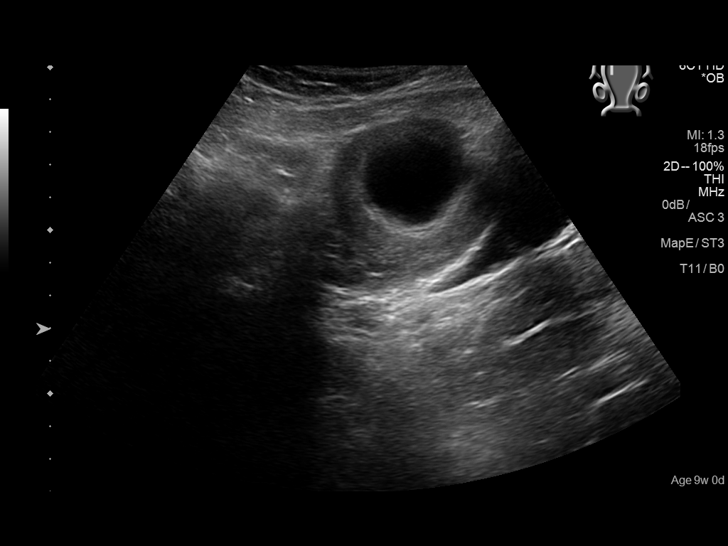
[im 23/67]
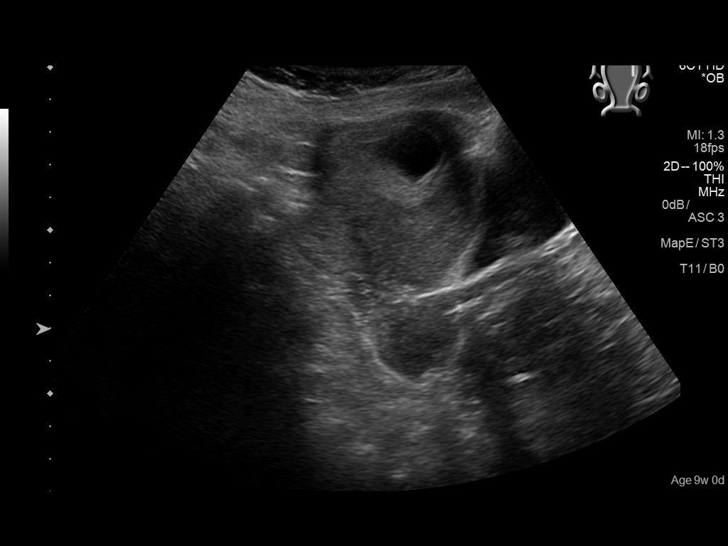
[im 27/67]
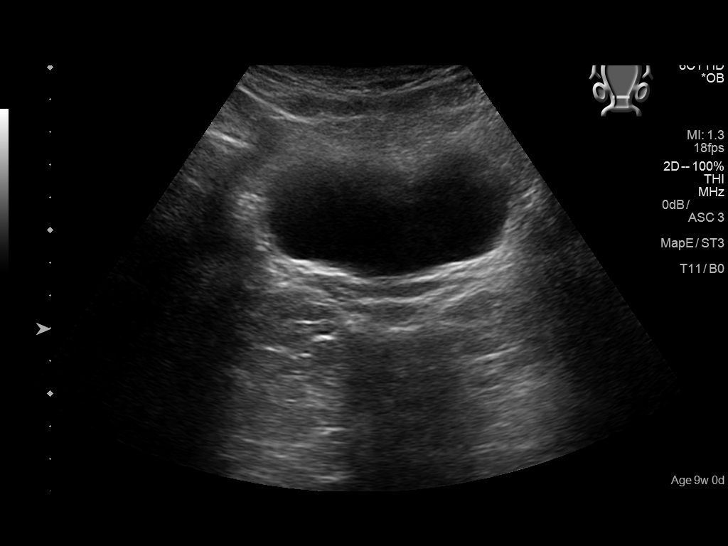
[im 32/67]
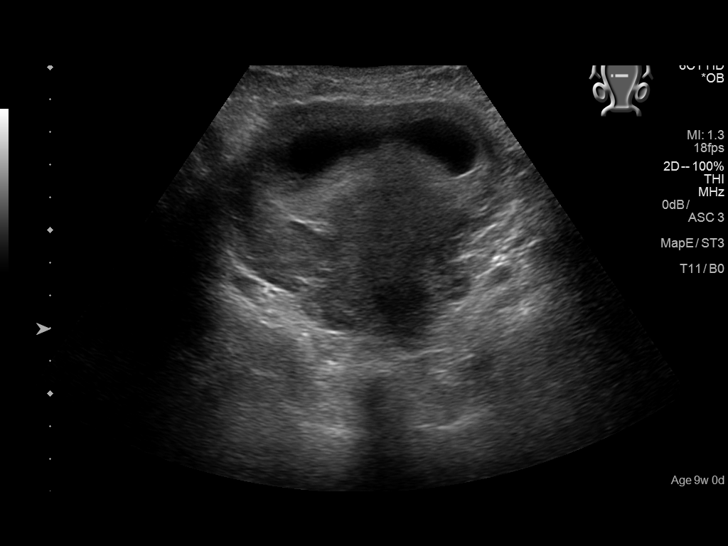
[im 37/67]
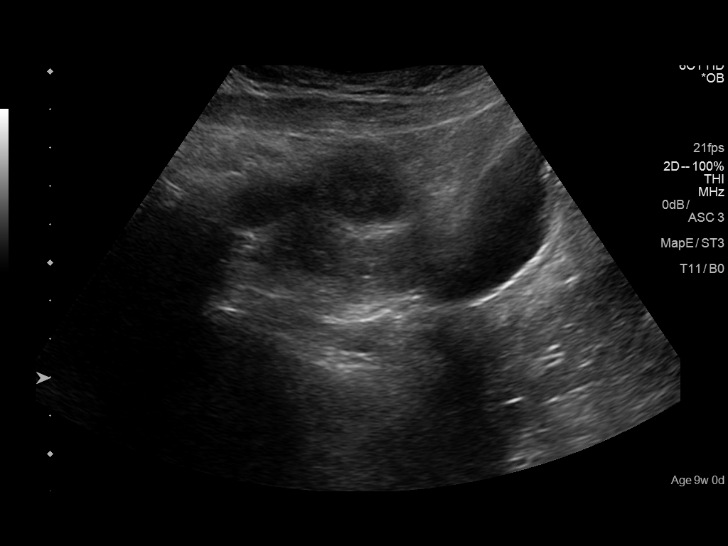
[im 42/67]
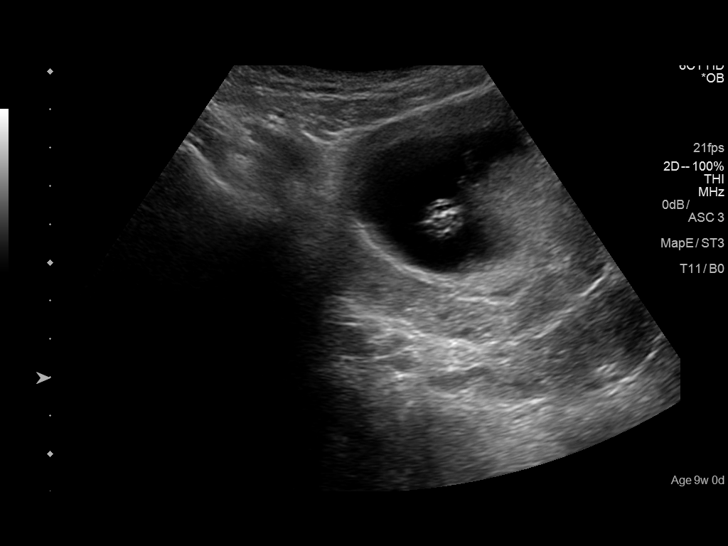
[im 47/67]
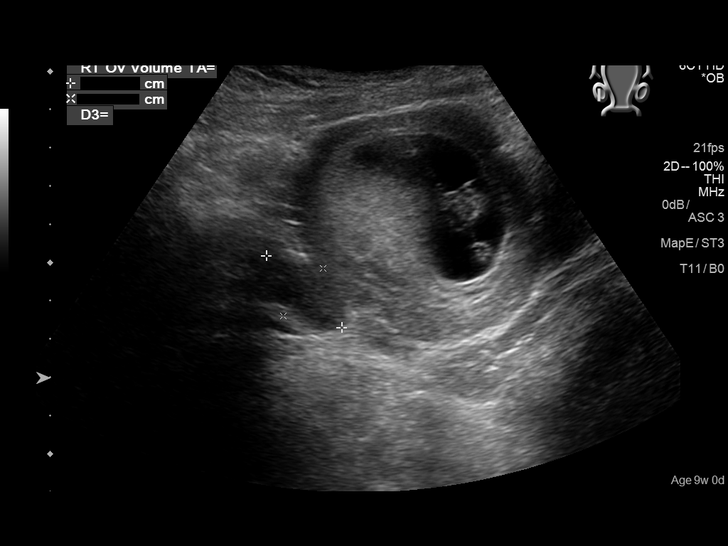
[im 52/67]
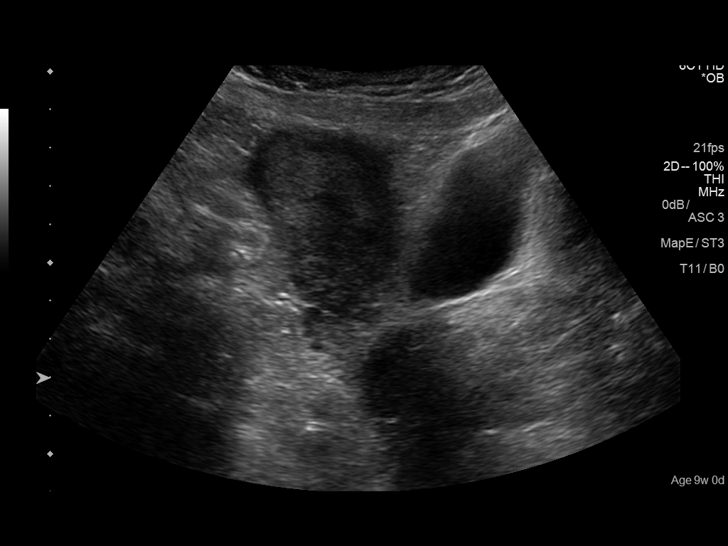
[im 57/67]
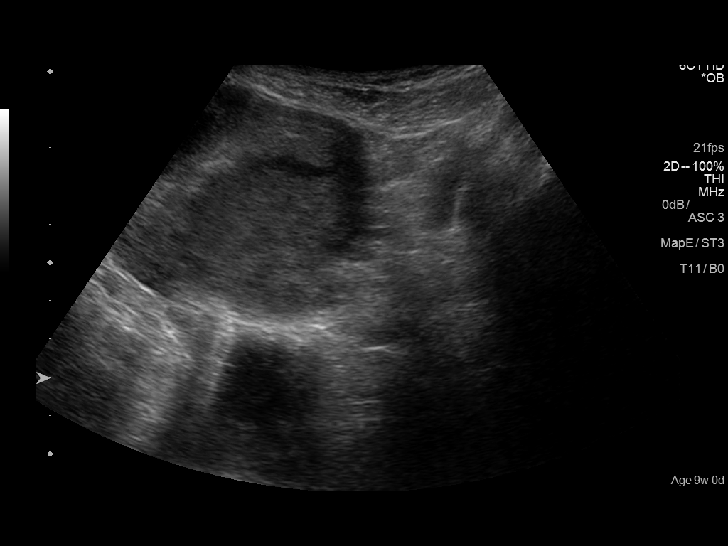
[im 62/67]
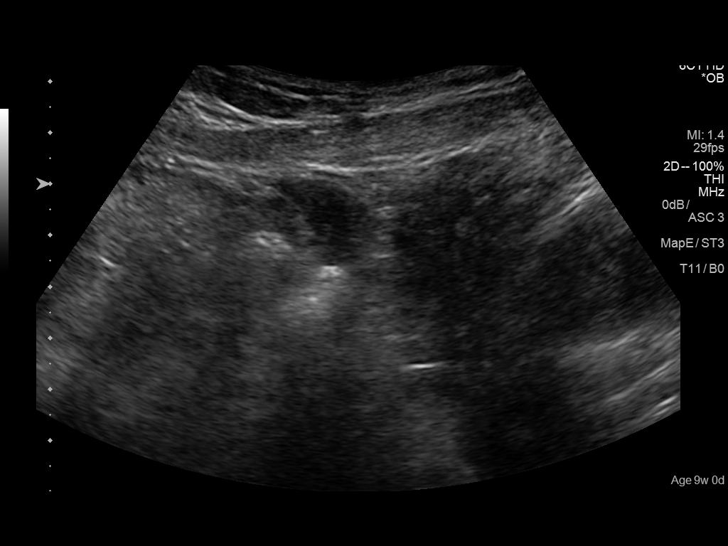
[im 67/67]
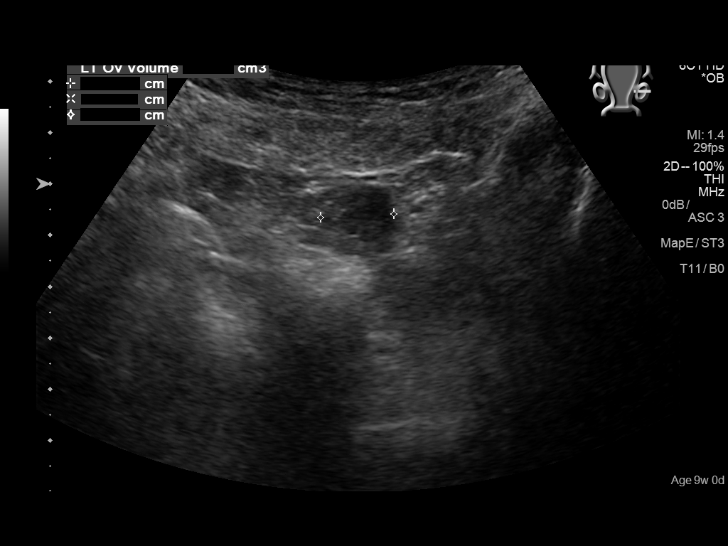

[14 of 28 positions shown; findings below may reference images not displayed]

FINDINGS: Intrauterine gestational sac: Present

Yolk sac:  Present

Embryo:  Present

Cardiac Activity: Present

Heart Rate: 175 bpm

CRL:   24  mm   9 w 1 d                  US EDC: 05/30/2017

Subchorionic hemorrhage:  None visualized.

Maternal uterus/adnexae: Normal appearance of the ovaries. No
significant free fluid.
IMPRESSION: 1. Intrauterine pregnancy of approximately 9 weeks 1 day with fetal
heart rate of 175 beats per minute.
2. No explanation for pain.

## 2022-01-17 ENCOUNTER — Encounter: Payer: Self-pay | Admitting: Physician Assistant

## 2022-01-17 ENCOUNTER — Ambulatory Visit (LOCAL_COMMUNITY_HEALTH_CENTER): Payer: Medicaid Other | Admitting: Physician Assistant

## 2022-01-17 VITALS — BP 126/80 | HR 106 | Ht 62.0 in | Wt 179.4 lb

## 2022-01-17 DIAGNOSIS — Z3202 Encounter for pregnancy test, result negative: Secondary | ICD-10-CM | POA: Diagnosis not present

## 2022-01-17 DIAGNOSIS — Z3009 Encounter for other general counseling and advice on contraception: Secondary | ICD-10-CM

## 2022-01-17 DIAGNOSIS — Z30013 Encounter for initial prescription of injectable contraceptive: Secondary | ICD-10-CM | POA: Diagnosis not present

## 2022-01-17 DIAGNOSIS — Z98891 History of uterine scar from previous surgery: Secondary | ICD-10-CM

## 2022-01-17 DIAGNOSIS — Z01419 Encounter for gynecological examination (general) (routine) without abnormal findings: Secondary | ICD-10-CM

## 2022-01-17 DIAGNOSIS — Z309 Encounter for contraceptive management, unspecified: Secondary | ICD-10-CM

## 2022-01-17 DIAGNOSIS — Z720 Tobacco use: Secondary | ICD-10-CM

## 2022-01-17 LAB — PREGNANCY, URINE: Preg Test, Ur: NEGATIVE

## 2022-01-17 LAB — WET PREP FOR TRICH, YEAST, CLUE
Trichomonas Exam: NEGATIVE
Yeast Exam: NEGATIVE

## 2022-01-17 MED ORDER — MEDROXYPROGESTERONE ACETATE 150 MG/ML IM SUSP
150.0000 mg | INTRAMUSCULAR | Status: AC
  Administered 2022-01-17 – 2022-10-01 (×4): 150 mg via INTRAMUSCULAR

## 2022-01-17 NOTE — Progress Notes (Signed)
[facility] DEPARTMENT [facility] [address] Main Number: [phone]    Family Planning Visit- Initial Visit  Subjective:  Shelby Cooper is a 26 y.o.  [id number]   being seen today for an initial annual visit and to discuss reproductive life planning.  The patient is currently using No Method - No Contraceptive Precautions for pregnancy prevention. Patient reports   does not want a pregnancy in the next year.     report they are looking for a method that provides Minimal bleeding/improved bleeding profile  Patient has the following medical conditions has Previous cesarean section complicating pregnancy; History of cesarean delivery; Iron deficiency anemia; and Delivery by cesarean section of full-term infant on their problem list.  Chief Complaint  Patient presents with   Annual Exam   Contraception    Patient reports last sex 6/21 or 6/22 without a condom. Desires to restart DMPA (last about 4 y ago). LMP 12/05/21.  Patient denies h/o problems with DMPA.   Body mass index is 32.81 kg/m. - Patient is eligible for diabetes screening based on BMI and age >47?  no HA1C ordered? not applicable  Patient reports 1  partner/s in last year. Desires STI screening?  Yes  Has patient been screened once for HCV in the past?  No  No results found for: "HCVAB"  Does the patient have current drug use (including MJ), have a partner with drug use, and/or has been incarcerated since last result? No  If yes-- Screen for HCV through [facility]   Does the patient meet criteria for HBV testing? No  Criteria:  -Household, sexual or needle sharing contact with HBV -History of drug use -HIV positive -Those with known Hep C   Health Maintenance Due  Topic Date Due   COVID-19 Vaccine (1) Never done   Hepatitis C Screening  Never done   HPV VACCINES (3 - 3-dose series) 12/24/2013   PAP-Cervical Cytology Screening  11/12/2021   PAP SMEAR-Modifier   11/12/2021    Review of Systems  Constitutional: Negative.   HENT: Negative.    Eyes: Negative.   Respiratory: Negative.    Cardiovascular: Negative.   Gastrointestinal: Negative.   Genitourinary: Negative.   Musculoskeletal: Negative.   Skin: Negative.   Neurological: Negative.   Endo/Heme/Allergies: Negative.   Psychiatric/Behavioral: Negative.      The following portions of the patient's history were reviewed and updated as appropriate: allergies, current medications, past family history, past medical history, past social history, past surgical history and problem list. Problem list updated.   See flowsheet for other program required questions.  Objective:   Vitals:   01/17/22 1511  BP: 126/80  Pulse: (!) 106  Weight: 179 lb 6.4 oz (81.4 kg)  Height: 5\' 2"  (1.575 m)    Physical Exam Vitals and nursing note reviewed.  Constitutional:      Appearance: Normal appearance. She is obese.  HENT:     Head: Normocephalic and atraumatic.  Cardiovascular:     Rate and Rhythm: Normal rate and regular rhythm.     Heart sounds: Normal heart sounds.  Pulmonary:     Effort: Pulmonary effort is normal.     Breath sounds: Normal breath sounds.  Chest:  Breasts:    Tanner Score is 5.     Right: Normal.     Left: Normal.  Abdominal:     Palpations: Abdomen is soft.  Genitourinary:    General: Normal vulva.  Exam position: Lithotomy position.     Tanner stage (genital): 5.     Labia:        Right: No lesion.        Left: No lesion.      Urethra: No urethral pain or urethral lesion.     Vagina: Vaginal discharge present. No bleeding or lesions.     Cervix: Cervical bleeding present. No cervical motion tenderness, discharge, friability or lesion.     Uterus: Not tender.      Adnexa:        Right: No tenderness.         Left: No tenderness.       Rectum: No external hemorrhoid.     Comments: Scant clear vag discharge, pH 4.0; pink discharge at cervical os may be c/w  early menses Musculoskeletal:        General: Normal range of motion.  Lymphadenopathy:     Lower Body: No right inguinal adenopathy. No left inguinal adenopathy.  Skin:    General: Skin is warm and dry.  Neurological:     General: No focal deficit present.     Mental Status: She is alert.  Psychiatric:        Mood and Affect: Mood normal.        Behavior: Behavior normal.       Assessment and Plan:  Shelby Cooper is a 26 y.o. female presenting to the [facility] Department for an initial annual wellness/contraceptive visit  Contraception counseling: Reviewed options based on patient desire and reproductive life plan. Patient is interested in Hormonal Injection. This was provided to the patient today.   Risks, benefits, and typical effectiveness rates were reviewed.  Questions were answered.  Written information was also given to the patient to review.    The patient will follow up in  3 months for surveillance.  The patient was told to call with any further questions, or with any concerns about this method of contraception.  Emphasized use of condoms 100% of the time for STI prevention.  Need for ECP was assessed. Patient reported > 120 hours .  Reviewed options and patient desired No method of ECP, declined all    1. Family planning UPT = neg. Start DMPA today. Recommend pt check home pregnancy test in 2 weeks and return to clinic if pos. - Pregnancy, urine - medroxyPROGESTERone (DEPO-PROVERA) injection 150 mg  2. Encounter for well woman exam with routine gynecological exam Enc to get PCP for routine/acute care needs. Due for cervical cancer screening. - IGP, rfx Aptima HPV ASCU - WET PREP FOR TRICH, YEAST, CLUE - Chlamydia/Gonorrhea [facility]  3. Tobacco abuse Encouraged smoking cessation. Referred to [facility].   Return in about 3 months (around 04/19/2022) for Routine DMPA injection.  No future appointments.  Landry Dyke, PA-C

## 2022-01-17 NOTE — Progress Notes (Signed)
LMP 12/05/2021 and client reports unprotected intercourse multiple times since. No BCM currently used. Per A. Streilein PA-C, UPT prior to proceeding with appt. UPT negative today. Jossie Ng, RN Wet prep reviewed - negative results. Client requested Depo in her arm and tolerated without complaint. Jossie Ng, RN

## 2022-01-22 LAB — IGP, RFX APTIMA HPV ASCU: PAP Smear Comment: 0

## 2022-04-04 ENCOUNTER — Ambulatory Visit: Payer: Medicaid Other

## 2022-04-22 ENCOUNTER — Ambulatory Visit (LOCAL_COMMUNITY_HEALTH_CENTER): Payer: Medicaid Other

## 2022-04-22 ENCOUNTER — Ambulatory Visit: Payer: Medicaid Other

## 2022-04-22 VITALS — BP 135/83 | Wt 179.0 lb

## 2022-04-22 DIAGNOSIS — Z30013 Encounter for initial prescription of injectable contraceptive: Secondary | ICD-10-CM | POA: Diagnosis not present

## 2022-04-22 DIAGNOSIS — Z3009 Encounter for other general counseling and advice on contraception: Secondary | ICD-10-CM

## 2022-04-22 DIAGNOSIS — Z309 Encounter for contraceptive management, unspecified: Secondary | ICD-10-CM

## 2022-04-22 DIAGNOSIS — Z3042 Encounter for surveillance of injectable contraceptive: Secondary | ICD-10-CM

## 2022-04-22 NOTE — Progress Notes (Signed)
13 weeks 4 days post depo.  Denies problems with depo.  Depo given IM right deltoid; tolerated well.  Nest depo due 07/08/22; has appt reminder.  Tonny Branch, RN

## 2022-07-12 ENCOUNTER — Ambulatory Visit (LOCAL_COMMUNITY_HEALTH_CENTER): Payer: Medicaid Other

## 2022-07-12 VITALS — BP 128/86 | Ht 62.0 in | Wt 178.0 lb

## 2022-07-12 DIAGNOSIS — Z3042 Encounter for surveillance of injectable contraceptive: Secondary | ICD-10-CM

## 2022-07-12 DIAGNOSIS — Z3009 Encounter for other general counseling and advice on contraception: Secondary | ICD-10-CM

## 2022-07-12 DIAGNOSIS — Z309 Encounter for contraceptive management, unspecified: Secondary | ICD-10-CM | POA: Diagnosis not present

## 2022-07-12 NOTE — Progress Notes (Signed)
11 weeks 4 days post depo. Voices no concerns. Depo given today per order by A Streilein, PA-C dated 01/17/2022. Tolerated well L delt. Next depo due 09/27/2022, has reminder. Josie Saunders, RN

## 2022-10-01 ENCOUNTER — Ambulatory Visit: Payer: Medicaid Other

## 2022-10-01 ENCOUNTER — Ambulatory Visit (LOCAL_COMMUNITY_HEALTH_CENTER): Payer: Medicaid Other

## 2022-10-01 VITALS — BP 137/86 | Ht 62.0 in | Wt 180.0 lb

## 2022-10-01 DIAGNOSIS — Z309 Encounter for contraceptive management, unspecified: Secondary | ICD-10-CM | POA: Diagnosis not present

## 2022-10-01 DIAGNOSIS — Z3009 Encounter for other general counseling and advice on contraception: Secondary | ICD-10-CM

## 2022-10-01 DIAGNOSIS — Z3042 Encounter for surveillance of injectable contraceptive: Secondary | ICD-10-CM | POA: Diagnosis not present

## 2022-10-01 NOTE — Progress Notes (Signed)
11 weeks and 3 days post hormonal injection.  Patient reported no complaints or concerns.  Medroxyprogesterone Acetate 150 mg given as ordered by Joen Laura, PA-C on 01/17/2022 x 1 year. Given in IM in right deltoid.  Tolerated well. Reminder card provided for next appointment due 12/17/2022.

## 2022-12-19 ENCOUNTER — Ambulatory Visit: Payer: Medicaid Other

## 2022-12-19 VITALS — BP 133/86 | Ht 62.0 in | Wt 175.0 lb

## 2022-12-19 DIAGNOSIS — Z309 Encounter for contraceptive management, unspecified: Secondary | ICD-10-CM

## 2022-12-19 DIAGNOSIS — Z3009 Encounter for other general counseling and advice on contraception: Secondary | ICD-10-CM

## 2022-12-19 DIAGNOSIS — Z30013 Encounter for initial prescription of injectable contraceptive: Secondary | ICD-10-CM

## 2022-12-19 DIAGNOSIS — Z3042 Encounter for surveillance of injectable contraceptive: Secondary | ICD-10-CM

## 2022-12-19 MED ORDER — MEDROXYPROGESTERONE ACETATE 150 MG/ML IM SUSP
150.0000 mg | Freq: Once | INTRAMUSCULAR | Status: AC
  Administered 2022-12-19: 150 mg via INTRAMUSCULAR

## 2022-12-19 NOTE — Progress Notes (Signed)
11 weeks 2 days post depo. Voices no concerns. Depo given today per S.O Ralene Bathe, MD). Tolerated well in L deltoid. Patient counseled to return on or after 01/19/2023 for physical. Next depo due 03/06/2023, patient aware.  Shelby Kitchens, RN

## 2023-03-26 ENCOUNTER — Other Ambulatory Visit: Payer: Self-pay

## 2023-03-26 ENCOUNTER — Ambulatory Visit: Payer: Medicaid Other

## 2023-03-26 ENCOUNTER — Ambulatory Visit: Payer: Medicaid Other | Admitting: Advanced Practice Midwife

## 2023-03-26 ENCOUNTER — Encounter: Payer: Self-pay | Admitting: Advanced Practice Midwife

## 2023-03-26 VITALS — BP 130/86 | HR 116 | Temp 99.7°F | Ht 62.0 in | Wt 172.4 lb

## 2023-03-26 DIAGNOSIS — Z309 Encounter for contraceptive management, unspecified: Secondary | ICD-10-CM | POA: Diagnosis not present

## 2023-03-26 DIAGNOSIS — Z30013 Encounter for initial prescription of injectable contraceptive: Secondary | ICD-10-CM | POA: Diagnosis not present

## 2023-03-26 DIAGNOSIS — N6452 Nipple discharge: Secondary | ICD-10-CM

## 2023-03-26 DIAGNOSIS — Z3042 Encounter for surveillance of injectable contraceptive: Secondary | ICD-10-CM

## 2023-03-26 MED ORDER — MEDROXYPROGESTERONE ACETATE 150 MG/ML IM SUSP
150.0000 mg | INTRAMUSCULAR | Status: AC
  Administered 2023-03-26 – 2023-11-20 (×4): 150 mg via INTRAMUSCULAR

## 2023-03-26 NOTE — Progress Notes (Signed)
[facility] DEPARTMENT [facility] [address] Main Number: [phone]   Family Planning Visit- Initial Visit  Subjective:  Shelby Cooper is a 27 y.o.  [id number]   being seen today for an initial annual visit and to discuss reproductive life planning.  The patient is currently using Hormonal Injection for pregnancy prevention. Patient reports   does not want a pregnancy in the next year.    They report they are looking for a method that provides High efficacy at preventing pregnancy  Patient has the following medical conditions has History of cesarean delivery; Iron deficiency anemia; Tobacco abuse; and Discharge from right nipple on their problem list.  Chief Complaint  Patient presents with   Annual Exam    PE and Depo    HPI: Patient reports discharge from right nipple that began 3 months ago. States that it is similar to popping a pimple, brown discharge is expressed and then turns clear. Denies blood from the nipple. Patient reports that about every 3 weeks she notices the right nipple is larger than the left and she expresses milky, brown fluid from nipple. Cannot remember anything specific happening to cause this to occur. Denies pain. Has not tried any OTC medications or creams. Denies associated symptoms - no fevers, chills, n/v, or skin changes to the breast, no warmth or erythema to the breast. Last child born in 2020. Not currently breastfeeding and no history of breastfeeding.   Patient also reports to clinic today for depo injection. This is not a new initiation of depo and the last injection was given 12/19/22. She is [redacted]w[redacted]d. The patient reports her periods are irregular and she has spotting being on the depo injection. She cannot give an exact LMP. Last sexual intercourse was "2.5 weeks ago with a condom."  Last PAP 01/17/22, NILM. No HPV reflex done, not indicated per guidelines. Previous PAP 11/13/18 NILM. No HPV reflex done, not  indicated per guidelines.  Body mass index is 31.53 kg/m. - Patient is eligible for diabetes screening based on BMI> 25 and age >35?  no HA1C ordered? not applicable  Patient reports 1  partner/s in last year. Desires STI screening?  No - Patient declines today.   Has patient been screened once for HCV in the past?  No  No results found for: "HCVAB"  Does the patient have current drug use (including MJ), have a partner with drug use, and/or has been incarcerated since last result? No  If yes-- Screen for HCV through [facility]   Does the patient meet criteria for HBV testing? No  Criteria:  -Household, sexual or needle sharing contact with HBV -History of drug use -HIV positive -Those with known Hep C   Health Maintenance Due  Topic Date Due   Hepatitis C Screening  Never done   HPV VACCINES (3 - 3-dose series) 11/16/2013   INFLUENZA VACCINE  Never done   COVID-19 Vaccine (1 - 2023-24 season) Never done    Review of Systems  Skin:        Brown discharge from nipple. See note for further details.   All other systems reviewed and are negative.   The following portions of the patient's history were reviewed and updated as appropriate: allergies, current medications, past family history, past medical history, past social history, past surgical history and problem list. Problem list updated.   See flowsheet for other program required questions.  Objective:   Vitals:   03/26/23 1000 03/26/23  1115  BP: 130/86   Pulse: (!) 120 (!) 116  Temp:  99.7 F (37.6 C)  TempSrc:  Oral  SpO2:  99%  Weight: 172 lb 6.4 oz (78.2 kg)   Height: 5\' 2"  (1.575 m)     Physical Exam Vitals and nursing note reviewed. Exam conducted with a chaperone present Ike Bene, CNA present as chaperone for PE.).  Constitutional:      Appearance: Normal appearance.  HENT:     Head: Normocephalic.     Salivary Glands: Right salivary gland is not diffusely enlarged or tender. Left salivary  gland is not diffusely enlarged or tender.     Nose:     Comments: Patient wearing a disposable surgical mask.     Mouth/Throat:     Lips: Pink.     Mouth: Mucous membranes are moist.     Tongue: No lesions. Tongue does not deviate from midline.     Pharynx: Oropharynx is clear. Uvula midline. No oropharyngeal exudate.     Tonsils: No tonsillar exudate.  Eyes:     General:        Right eye: No discharge.        Left eye: No discharge.  Cardiovascular:     Rate and Rhythm: Regular rhythm. Tachycardia present.     Pulses: Normal pulses.          Radial pulses are 2+ on the right side and 2+ on the left side.     Heart sounds: Normal heart sounds, S1 normal and S2 normal.  Pulmonary:     Effort: Pulmonary effort is normal.     Breath sounds: Normal breath sounds and air entry.  Chest:  Breasts:    Tanner Score is 5.     Breasts are symmetrical.     Right: Nipple discharge present.     Left: Normal.     Comments: Patient expressed brown discharge from right nipple only during exam. No masses palpated to either breast, no warmth felt, or difference in skin temperature to breasts bilaterally. No visible skin concerns (rash, erythema) and no dimpling. Neither nipple inverted. Patient reported no tenderness or pain with breast palpation. Right nipple slightly larger in size than left nipple.  Abdominal:     General: Bowel sounds are normal. There is no distension.     Palpations: Abdomen is soft.     Tenderness: There is no abdominal tenderness. There is no guarding or rebound.  Genitourinary:    Comments: Declined genital exam- no symptoms. Declined STI testing.  Lymphadenopathy:     Head:     Right side of head: No submental, submandibular, tonsillar, preauricular or posterior auricular adenopathy.     Left side of head: No submental, submandibular, tonsillar, preauricular or posterior auricular adenopathy.     Cervical: No cervical adenopathy.     Right cervical: No superficial or  posterior cervical adenopathy.    Left cervical: No superficial or posterior cervical adenopathy.     Upper Body:     Right upper body: No supraclavicular or axillary adenopathy.     Left upper body: No supraclavicular or axillary adenopathy.  Skin:    General: Skin is warm and dry.     Comments: Skin tone appropriate for ethnicity. Exposed areas only.   Neurological:     Mental Status: She is alert and oriented to person, place, and time.  Psychiatric:        Attention and Perception: Attention normal.  Mood and Affect: Mood normal.        Speech: Speech normal.        Behavior: Behavior normal. Behavior is cooperative.    Assessment and Plan:  Shelby Cooper is a 27 y.o. female presenting to the [facility] Department for an initial annual wellness/contraceptive visit  Contraception counseling: Reviewed options based on patient desire and reproductive life plan. Patient is interested in Hormonal Injection. This was provided to the patient today.   Risks, benefits, and typical effectiveness rates were reviewed.  Questions were answered.  Written information was also given to the patient to review.    1. Encounter for surveillance of injectable contraceptive  - medroxyPROGESTERone (DEPO-PROVERA) injection 150 mg  The patient will follow up in  3 months for surveillance.  The patient was told to call with any further questions, or with any concerns about this method of contraception.  Emphasized use of condoms 100% of the time for STI prevention.  2. Discharge from right nipple Discussed right breast finding with Arnetha Courser, CNM who assessed patient. Together we felt Dr. Fayette Pho should see the patient. Dr. Fayette Pho assessed patient.    Differential Dx: Carcinoma- Most concerning at this time as other conditions are less likely. See referral note below this list.   Infection-Tier 2, Not highly likely as patient reports no additional symptoms to  support diagnosis. Patient does have 99.22F oral temp with tachycardia to 116bpm in clinic 03/26/23.  Medication Side Effect- Tier 1, Not highly likely as patient is experiencing unilateral breast discharge.   Paper referral to general breast surgeon, Dr. Hazle Quant and Dr. Tonna Boehringer, both with [facility] in [place]. Paper referral form completed by Marylu Lund L. Rashon Rezek, FNP-C. Approved by Dr. Fayette Pho, M.D. Given to Justice Rocher, RN to fax on 03/26/23 at end of day.  Return in about 3 months (around 06/26/2023) for Depo injection.  No future appointments.  Edmonia James, NP I was mentoring this practitioner. Needs to chart last pap and results. Hazle Coca, CNM

## 2023-03-26 NOTE — Assessment & Plan Note (Signed)
Differential Dx: Carcinoma- Most concerning at this time as other conditions are less likely. See referral note below this list.  Infection-Tier 2, Not highly likely as patient reports no additional symptoms to support diagnosis. Patient does have 99.55F oral temp with tachycardia to 116bpm in clinic 03/26/23. Medication Side Effect- Tier 1, Not highly likely as patient is experiencing unilateral breast discharge.    Paper referral to general breast surgeon, Dr. Hazle Quant and Dr. Tonna Boehringer, both with [facility] in [place]. Paper referral form completed by Marylu Lund L. Jillaine Waren, FNP-C. Approved by Dr. Fayette Pho, M.D. Given to Justice Rocher, RN to fax on 03/26/23 at end of day.

## 2023-03-26 NOTE — Progress Notes (Signed)
Pt is here for PE and Depo injection. Depo 150 mg given IM in Rt deltoid.  Pt tolerated well. FP packet and reminder card given to pt to return in 11-13 weeks for next Depo.  Berdie Ogren, RN

## 2023-03-26 NOTE — Assessment & Plan Note (Deleted)
Differential Dx: Carcinoma- Most concerning at this time as other conditions are less likely. See referral note below this list.  Infection-Tier 2, Not highly likely as patient reports no additional symptoms to support diagnosis. Patient does have 99.55F oral temp with tachycardia to 116bpm in clinic 03/26/23. Medication Side Effect- Tier 1, Not highly likely as patient is experiencing unilateral breast discharge.    Paper referral to general breast surgeon, Dr. Hazle Quant and Dr. Tonna Boehringer, both with [facility] in [place]. Paper referral form completed by Marylu Lund L. Jillaine Waren, FNP-C. Approved by Dr. Fayette Pho, M.D. Given to Justice Rocher, RN to fax on 03/26/23 at end of day.

## 2023-04-11 ENCOUNTER — Other Ambulatory Visit: Payer: Self-pay | Admitting: General Surgery

## 2023-04-11 DIAGNOSIS — N6452 Nipple discharge: Secondary | ICD-10-CM

## 2023-06-16 ENCOUNTER — Ambulatory Visit: Payer: Medicaid Other

## 2023-06-16 VITALS — BP 126/76 | Ht 62.0 in | Wt 172.5 lb

## 2023-06-16 DIAGNOSIS — Z309 Encounter for contraceptive management, unspecified: Secondary | ICD-10-CM | POA: Diagnosis not present

## 2023-06-16 DIAGNOSIS — Z30013 Encounter for initial prescription of injectable contraceptive: Secondary | ICD-10-CM

## 2023-06-16 DIAGNOSIS — Z3009 Encounter for other general counseling and advice on contraception: Secondary | ICD-10-CM

## 2023-06-16 DIAGNOSIS — Z3042 Encounter for surveillance of injectable contraceptive: Secondary | ICD-10-CM

## 2023-06-16 NOTE — Progress Notes (Signed)
11 Weeks   5 Days since last Depo   Voices no concerns today.  Counseled to adhere to 11 to 13 week intervals between depo injections for optimal benefit.  Depo given today per order by Leona Singleton, NP  dated 03/26/2023.  Tolerated well left delt.  Next depo due 09/01/2023,  has reminder card.  Jerel Shepherd, RN

## 2023-09-01 ENCOUNTER — Ambulatory Visit (LOCAL_COMMUNITY_HEALTH_CENTER)

## 2023-09-01 ENCOUNTER — Ambulatory Visit

## 2023-09-01 VITALS — BP 129/76 | Ht 62.0 in | Wt 172.0 lb

## 2023-09-01 DIAGNOSIS — Z30013 Encounter for initial prescription of injectable contraceptive: Secondary | ICD-10-CM | POA: Diagnosis not present

## 2023-09-01 DIAGNOSIS — Z3042 Encounter for surveillance of injectable contraceptive: Secondary | ICD-10-CM

## 2023-09-01 DIAGNOSIS — Z3009 Encounter for other general counseling and advice on contraception: Secondary | ICD-10-CM

## 2023-09-01 DIAGNOSIS — Z309 Encounter for contraceptive management, unspecified: Secondary | ICD-10-CM

## 2023-09-01 NOTE — Progress Notes (Signed)
 11 Weeks   0 Days since last Depo Voices no concerns today.  Counseled to adhere to 11 to 13 week intervals between depo injections for optimal benefit.  Depo given today per order by Leona Singleton, FNP   dated 03/26/2023.  Tolerated well R Delt.  Next depo due 11/17/2023 has reminder card.  Jerel Shepherd, RN

## 2023-11-20 ENCOUNTER — Ambulatory Visit (LOCAL_COMMUNITY_HEALTH_CENTER)

## 2023-11-20 VITALS — BP 133/86 | Ht 62.0 in | Wt 178.0 lb

## 2023-11-20 DIAGNOSIS — Z30013 Encounter for initial prescription of injectable contraceptive: Secondary | ICD-10-CM | POA: Diagnosis not present

## 2023-11-20 DIAGNOSIS — Z309 Encounter for contraceptive management, unspecified: Secondary | ICD-10-CM

## 2023-11-20 DIAGNOSIS — Z3042 Encounter for surveillance of injectable contraceptive: Secondary | ICD-10-CM

## 2023-11-20 DIAGNOSIS — Z3009 Encounter for other general counseling and advice on contraception: Secondary | ICD-10-CM

## 2023-11-20 NOTE — Progress Notes (Signed)
 11w 3d post depo. Voices no concerns. Depo given today per order by Claryce Cruel, NP dated 03/26/2023. Tolerated well in L deltoid. Next depo due 02/05/2024; patient aware.   Clare Critchley, RN
